# Patient Record
Sex: Female | Born: 1990 | Race: Black or African American | Hispanic: No | Marital: Single | Smoking: Never smoker
Health system: Southern US, Community
[De-identification: ages and names within clinical notes are randomized; demographics above are authoritative.]

## PROBLEM LIST (undated history)

## (undated) DIAGNOSIS — O98819 Other maternal infectious and parasitic diseases complicating pregnancy, unspecified trimester: Secondary | ICD-10-CM

## (undated) DIAGNOSIS — A749 Chlamydial infection, unspecified: Secondary | ICD-10-CM

## (undated) DIAGNOSIS — K859 Acute pancreatitis without necrosis or infection, unspecified: Secondary | ICD-10-CM

---

## 2015-03-31 NOTE — L&D Delivery Note (Signed)
Delivery Note At approximately 6:00 AM a viable female was delivered via Vaginal, Spontaneous Delivery at home (Presentation: unknown).  APGAR: unknown; weight pending Placenta status: spontaneous, intact by EMS, but membranes tore off and were still in the the vagina and LUS.  Cord: 3VC with the following complications: .  Cord pH: NA  Bleeding was initially moderate-heavy. Trailing membranes removed intact and several clots totaling 350 cc's were expressed from the uterus.  ~500 ml of blood on sheets and pads from EMS. EBL roughly estimated at 800 cc's. IV started. Pitocin bolus infusing. Bleeding small amount afterward.  Anesthesia: Local Episiotomy: None Lacerations: Perineum inspected. 2nd degree lac noted. Zerita Boersarlene Lawson, CNM assuming care of pt. See separate note  Mom to postpartum.  Baby to Couplet care / Skin to Skin. SW consult  Dorathy KinsmanVirginia Smith 01/05/2016, 8:44 AM

## 2015-08-26 ENCOUNTER — Emergency Department (HOSPITAL_COMMUNITY)
Admission: EM | Admit: 2015-08-26 | Discharge: 2015-08-26 | Disposition: A | Discharge status: Home / Self Care | Payer: 59 | Attending: Emergency Medicine | Admitting: Emergency Medicine

## 2015-08-26 ENCOUNTER — Encounter (HOSPITAL_COMMUNITY): Payer: Self-pay | Admitting: Emergency Medicine

## 2015-08-26 DIAGNOSIS — J069 Acute upper respiratory infection, unspecified: Secondary | ICD-10-CM | POA: Diagnosis not present

## 2015-08-26 DIAGNOSIS — J029 Acute pharyngitis, unspecified: Secondary | ICD-10-CM | POA: Diagnosis present

## 2015-08-26 LAB — RAPID STREP SCREEN (MED CTR MEBANE ONLY): Streptococcus, Group A Screen (Direct): NEGATIVE

## 2015-08-26 MED ORDER — FLUTICASONE PROPIONATE 50 MCG/ACT NA SUSP: 2.0000 | Freq: Every day | NASAL | Status: DC

## 2015-08-26 MED ORDER — BENZONATATE 100 MG PO CAPS: 100.0000 mg | ORAL_CAPSULE | Freq: Three times a day (TID) | ORAL | Status: DC

## 2015-08-26 MED ORDER — PENICILLIN G BENZATHINE 1200000 UNIT/2ML IM SUSP: 1.2000 10*6.[IU] | Freq: Once | INTRAMUSCULAR | Status: DC

## 2015-08-26 NOTE — Discharge Instructions (Signed)
Upper Respiratory Infection, Adult Most upper respiratory infections (URIs) are a viral infection of the air passages leading to the lungs. A URI affects the nose, throat, and upper air passages. The most common type of URI is nasopharyngitis and is typically referred to as "the common cold." URIs run their course and usually go away on their own. Most of the time, a URI does not require medical attention, but sometimes a bacterial infection in the upper airways can follow a viral infection. This is called a secondary infection. Sinus and middle ear infections are common types of secondary upper respiratory infections. Bacterial pneumonia can also complicate a URI. A URI can worsen asthma and chronic obstructive pulmonary disease (COPD). Sometimes, these complications can require emergency medical care and may be life threatening.  CAUSES Almost all URIs are caused by viruses. A virus is a type of germ and can spread from one person to another.  RISKS FACTORS You may be at risk for a URI if:   You smoke.   You have chronic heart or lung disease.  You have a weakened defense (immune) system.   You are very young or very old.   You have nasal allergies or asthma.  You work in crowded or poorly ventilated areas.  You work in health care facilities or schools. SIGNS AND SYMPTOMS  Symptoms typically develop 2-3 days after you come in contact with a cold virus. Most viral URIs last 7-10 days. However, viral URIs from the influenza virus (flu virus) can last 14-18 days and are typically more severe. Symptoms may include:   Runny or stuffy (congested) nose.   Sneezing.   Cough.   Sore throat.   Headache.   Fatigue.   Fever.   Loss of appetite.   Pain in your forehead, behind your eyes, and over your cheekbones (sinus pain).  Muscle aches.  DIAGNOSIS  Your health care provider may diagnose a URI by:  Physical exam.  Tests to check that your symptoms are not due to  another condition such as:  Strep throat.  Sinusitis.  Pneumonia.  Asthma. TREATMENT  A URI goes away on its own with time. It cannot be cured with medicines, but medicines may be prescribed or recommended to relieve symptoms. Medicines may help:  Reduce your fever.  Reduce your cough.  Relieve nasal congestion. HOME CARE INSTRUCTIONS   Take medicines only as directed by your health care provider.   Gargle warm saltwater or take cough drops to comfort your throat as directed by your health care provider.  Use a warm mist humidifier or inhale steam from a shower to increase air moisture. This may make it easier to breathe.  Drink enough fluid to keep your urine clear or pale yellow.   Eat soups and other clear broths and maintain good nutrition.   Rest as needed.   Return to work when your temperature has returned to normal or as your health care provider advises. You may need to stay home longer to avoid infecting others. You can also use a face mask and careful hand washing to prevent spread of the virus.  Increase the usage of your inhaler if you have asthma.   Do not use any tobacco products, including cigarettes, chewing tobacco, or electronic cigarettes. If you need help quitting, ask your health care provider. PREVENTION  The best way to protect yourself from getting a cold is to practice good hygiene.   Avoid oral or hand contact with people with cold   symptoms.   Wash your hands often if contact occurs.  There is no clear evidence that vitamin C, vitamin E, echinacea, or exercise reduces the chance of developing a cold. However, it is always recommended to get plenty of rest, exercise, and practice good nutrition.  SEEK MEDICAL CARE IF:   You are getting worse rather than better.   Your symptoms are not controlled by medicine.   You have chills.  You have worsening shortness of breath.  You have brown or red mucus.  You have yellow or brown nasal  discharge.  You have pain in your face, especially when you bend forward.  You have a fever.  You have swollen neck glands.  You have pain while swallowing.  You have white areas in the back of your throat. SEEK IMMEDIATE MEDICAL CARE IF:   You have severe or persistent:  Headache.  Ear pain.  Sinus pain.  Chest pain.  You have chronic lung disease and any of the following:  Wheezing.  Prolonged cough.  Coughing up blood.  A change in your usual mucus.  You have a stiff neck.  You have changes in your:  Vision.  Hearing.  Thinking.  Mood. MAKE SURE YOU:   Understand these instructions.  Will watch your condition.  Will get help right away if you are not doing well or get worse.   This information is not intended to replace advice given to you by your health care provider. Make sure you discuss any questions you have with your health care provider.   Document Released: 09/09/2000 Document Revised: 07/31/2014 Document Reviewed: 06/21/2013 Elsevier Interactive Patient Education 2016 Elsevier Inc.  

## 2015-08-26 NOTE — ED Notes (Signed)
Pt c/o sore throat onset 2 days ago. Pt has tried cough medication without relief.

## 2015-08-26 NOTE — ED Provider Notes (Signed)
CSN: [id number]     Arrival date & time 08/26/15  [id number] History   First MD Initiated Contact with Patient 08/26/15 [phone]     Chief Complaint  Patient presents with  . Sore Throat   HPI  25 year old female presents today with complaints of sore throat. Patient reports 2 days of upper respiratory complaints, she reports rhinorrhea, congestion, sore throat and cough. Patient denies any chest pain or shortness of breath, fever, chills, neck stiffness, change in voice, pulling of oral secretions or difficulty swallowing. Patient denies any other complaints, reports using over-the-counter cough medication.   History reviewed. No pertinent past medical history. History reviewed. No pertinent past surgical history. No family history on file. Social History  Substance Use Topics  . Smoking status: Never Smoker   . Smokeless tobacco: None  . Alcohol Use: No   OB History    No data available     Review of Systems  All other systems reviewed and are negative.   Allergies  Review of patient's allergies indicates no known allergies.  Home Medications   Prior to Admission medications   Medication Sig Start Date End Date Taking? Authorizing Provider  benzonatate (TESSALON) 100 MG capsule Take 1 capsule (100 mg total) by mouth every 8 (eight) hours. 08/26/15   Eyvonne MechanicJeffrey Anayla Giannetti, PA-C  fluticasone (FLONASE) 50 MCG/ACT nasal spray Place 2 sprays into both nostrils daily. 08/26/15   Tanganyika Bowlds, PA-C   BP 137/85 mmHg  Pulse 100  Temp(Src) 98.6 F (37 C) (Oral)  Resp 18  Ht 5' 3.5" (1.613 m)  Wt 110.281 kg  BMI 42.39 kg/m2  SpO2 100%   Physical Exam  Constitutional: She is oriented to person, place, and time. She appears well-developed and well-nourished.  HENT:  Head: Normocephalic and atraumatic.  Right Ear: Hearing, tympanic membrane, external ear and ear canal normal.  Left Ear: Hearing, tympanic membrane, external ear and ear canal normal.  Nose: Rhinorrhea present.  Mouth/Throat:  Uvula is midline, oropharynx is clear and moist and mucous membranes are normal. Mucous membranes are not pale, not dry and not cyanotic. No oropharyngeal exudate, posterior oropharyngeal edema, posterior oropharyngeal erythema or tonsillar abscesses.  Eyes: Conjunctivae are normal. Pupils are equal, round, and reactive to light. Right eye exhibits no discharge. Left eye exhibits no discharge. No scleral icterus.  Neck: Normal range of motion. No JVD present. No tracheal deviation present.  Pulmonary/Chest: Effort normal. No stridor.  Neurological: She is alert and oriented to person, place, and time. Coordination normal.  Psychiatric: She has a normal mood and affect. Her behavior is normal. Judgment and thought content normal.  Nursing note and vitals reviewed.   ED Course  Procedures (including critical care time) Labs Review Labs Reviewed  RAPID STREP SCREEN (NOT AT [facility])  CULTURE, GROUP A STREP [facility])    Imaging Review No results found. I have personally reviewed and evaluated these images and lab results as part of my medical decision-making.   EKG Interpretation None      MDM   Final diagnoses:  Viral URI    Labs:  Imaging:  Consults:  Therapeutics:  Discharge Meds:   Assessment/Plan: 25 year old female presents today with complaints of upper respiratory infection. Patient is afebrile nontoxic in no acute distress. Patient is not having chest pain shortness of breath, lung sounds are clear. This is likely viral in nature. Patient will be given prescription for cough medication, and instructed follow-up with primary care for reevaluation. Strict return precautions given.  Eyvonne Mechanic, PA-C 08/26/15 1357  Rolland Porter, MD 09/05/15 2312

## 2015-08-28 LAB — CULTURE, GROUP A STREP (THRC)

## 2015-10-28 ENCOUNTER — Encounter (HOSPITAL_COMMUNITY): Payer: Self-pay | Admitting: *Deleted

## 2015-10-28 ENCOUNTER — Ambulatory Visit (HOSPITAL_COMMUNITY)
Admission: EM | Admit: 2015-10-28 | Discharge: 2015-10-28 | Disposition: A | Discharge status: Home / Self Care | Payer: Self-pay | Attending: Family Medicine | Admitting: Family Medicine

## 2015-10-28 DIAGNOSIS — M778 Other enthesopathies, not elsewhere classified: Secondary | ICD-10-CM

## 2015-10-28 MED ORDER — MELOXICAM 7.5 MG PO TABS: 7.5000 mg | ORAL_TABLET | Freq: Two times a day (BID) | ORAL | 1 refills | Status: DC

## 2015-10-28 NOTE — ED Triage Notes (Signed)
Pt   Reports      Pain     l   Wrist    X   About  5   Days        Pt   denys   Any   specefic  Injury         Some  Swelling  Is  Present      To the   Affected  Wrist

## 2015-10-28 NOTE — ED Provider Notes (Signed)
[facility]    CSN: [id number] Arrival date & time: 10/28/15  1039  First Provider Contact:  None       History   Chief Complaint Chief Complaint  Patient presents with  . Wrist Pain    HPI Kristen Valentine is a 25 y.o. female.   The history is provided by the patient.  Wrist Pain  This is a new problem. The current episode started more than 2 days ago. The problem has been gradually worsening. The symptoms are aggravated by bending. She has tried acetaminophen for the symptoms. The treatment provided no relief.    History reviewed. No pertinent past medical history.  There are no active problems to display for this patient.   History reviewed. No pertinent surgical history.  OB History    No data available       Home Medications    Prior to Admission medications   Medication Sig Start Date End Date Taking? Authorizing Provider  benzonatate (TESSALON) 100 MG capsule Take 1 capsule (100 mg total) by mouth every 8 (eight) hours. 08/26/15   Eyvonne Mechanic, PA-C  fluticasone (FLONASE) 50 MCG/ACT nasal spray Place 2 sprays into both nostrils daily. 08/26/15   Eyvonne Mechanic, PA-C    Family History History reviewed. No pertinent family history.  Social History Social History  Substance Use Topics  . Smoking status: Never Smoker  . Smokeless tobacco: Not on file  . Alcohol use No     Allergies   Review of patient's allergies indicates no known allergies.   Review of Systems Review of Systems  Musculoskeletal: Negative for back pain, gait problem, joint swelling and neck pain.  Skin: Negative.   All other systems reviewed and are negative.    Physical Exam Triage Vital Signs ED Triage Vitals  Enc Vitals Group     BP 10/28/15 1203 142/86     Pulse Rate 10/28/15 1203 78     Resp 10/28/15 1203 18     Temp 10/28/15 1203 98.6 F (37 C)     Temp Source 10/28/15 1203 Oral     SpO2 10/28/15 1203 100 %     Weight --      Height --      Head  Circumference --      Peak Flow --      Pain Score 10/28/15 1210 7     Pain Loc --      Pain Edu? --      Excl. in GC? --    No data found.   Updated Vital Signs BP 142/86 (BP Location: Right Arm)   Pulse 78   Temp 98.6 F (37 C) (Oral)   Resp 18   SpO2 100%   Visual Acuity Right Eye Distance:   Left Eye Distance:   Bilateral Distance:    Right Eye Near:   Left Eye Near:    Bilateral Near:     Physical Exam  Constitutional: She is oriented to person, place, and time. She appears well-developed and well-nourished. No distress.  Musculoskeletal:       Left wrist: She exhibits tenderness, swelling and crepitus. She exhibits normal range of motion, no bony tenderness, no effusion and no deformity.  Neurological: She is alert and oriented to person, place, and time.  Skin: Skin is warm and dry.     UC Treatments / Results  Labs (all labs ordered are listed, but only abnormal results are displayed) Labs Reviewed - No data to display  EKG  EKG Interpretation None       Radiology No results found.  Procedures Procedures (including critical care time)  Medications Ordered in UC Medications - No data to display   Initial Impression / Assessment and Plan / UC Course  I have reviewed the triage vital signs and the nursing notes.  Pertinent labs & imaging results that were available during my care of the patient were reviewed by me and considered in my medical decision making (see chart for details).  Clinical Course      Final Clinical Impressions(s) / UC Diagnoses   Final diagnoses:  None    New Prescriptions New Prescriptions   No medications on file     Linna Hoff, MD 10/28/15 1500

## 2016-01-05 ENCOUNTER — Encounter (HOSPITAL_COMMUNITY): Payer: Self-pay

## 2016-01-05 ENCOUNTER — Inpatient Hospital Stay (HOSPITAL_COMMUNITY)
Admission: AD | Admit: 2016-01-05 | Discharge: 2016-01-09 | DRG: 769 | Disposition: A | Discharge status: Home / Self Care | Payer: Self-pay | Source: Ambulatory Visit | Attending: Obstetrics & Gynecology | Admitting: Obstetrics & Gynecology

## 2016-01-05 DIAGNOSIS — A568 Sexually transmitted chlamydial infection of other sites: Secondary | ICD-10-CM

## 2016-01-05 DIAGNOSIS — O98819 Other maternal infectious and parasitic diseases complicating pregnancy, unspecified trimester: Secondary | ICD-10-CM

## 2016-01-05 DIAGNOSIS — O9832 Other infections with a predominantly sexual mode of transmission complicating childbirth: Secondary | ICD-10-CM

## 2016-01-05 DIAGNOSIS — O9081 Anemia of the puerperium: Secondary | ICD-10-CM | POA: Diagnosis present

## 2016-01-05 DIAGNOSIS — A749 Chlamydial infection, unspecified: Secondary | ICD-10-CM | POA: Diagnosis present

## 2016-01-05 DIAGNOSIS — O133 Gestational [pregnancy-induced] hypertension without significant proteinuria, third trimester: Secondary | ICD-10-CM

## 2016-01-05 DIAGNOSIS — D649 Anemia, unspecified: Secondary | ICD-10-CM | POA: Diagnosis present

## 2016-01-05 HISTORY — DX: Chlamydial infection, unspecified: A74.9

## 2016-01-05 HISTORY — DX: Other maternal infectious and parasitic diseases complicating pregnancy, unspecified trimester: O98.819

## 2016-01-05 LAB — CBC
HEMATOCRIT: 26.3 % — AB (ref 36.0–46.0)
HEMOGLOBIN: 8.4 g/dL — AB (ref 12.0–15.0)
MCH: 20.5 pg — AB (ref 26.0–34.0)
MCHC: 31.9 g/dL (ref 30.0–36.0)
MCV: 64.3 fL — ABNORMAL LOW (ref 78.0–100.0)
Platelets: 336 10*3/uL (ref 150–400)
RBC: 4.09 MIL/uL (ref 3.87–5.11)
RDW: 17.4 % — AB (ref 11.5–15.5)
WBC: 13.9 10*3/uL — ABNORMAL HIGH (ref 4.0–10.5)

## 2016-01-05 LAB — RAPID URINE DRUG SCREEN, HOSP PERFORMED
AMPHETAMINES: NOT DETECTED
BARBITURATES: NOT DETECTED
BENZODIAZEPINES: NOT DETECTED
COCAINE: NOT DETECTED
Opiates: NOT DETECTED
TETRAHYDROCANNABINOL: NOT DETECTED

## 2016-01-05 LAB — URINALYSIS, ROUTINE W REFLEX MICROSCOPIC
BILIRUBIN URINE: NEGATIVE
GLUCOSE, UA: NEGATIVE mg/dL
KETONES UR: NEGATIVE mg/dL
Leukocytes, UA: NEGATIVE
Nitrite: POSITIVE — AB
PH: 6 (ref 5.0–8.0)
Specific Gravity, Urine: 1.015 (ref 1.005–1.030)

## 2016-01-05 LAB — URINE MICROSCOPIC-ADD ON: RBC / HPF: NONE SEEN RBC/hpf (ref 0–5)

## 2016-01-05 LAB — COMPREHENSIVE METABOLIC PANEL
ALBUMIN: 1.9 g/dL — AB (ref 3.5–5.0)
ALK PHOS: 130 U/L — AB (ref 38–126)
ALT: 6 U/L — AB (ref 14–54)
ANION GAP: 10 (ref 5–15)
AST: 18 U/L (ref 15–41)
BILIRUBIN TOTAL: 0.7 mg/dL (ref 0.3–1.2)
BUN: 11 mg/dL (ref 6–20)
CALCIUM: 8.9 mg/dL (ref 8.9–10.3)
CO2: 20 mmol/L — AB (ref 22–32)
CREATININE: 0.8 mg/dL (ref 0.44–1.00)
Chloride: 105 mmol/L (ref 101–111)
GFR calc Af Amer: >60 mL/min (ref >60)
GFR calc non Af Amer: >60 mL/min (ref >60)
GLUCOSE: 89 mg/dL (ref 65–99)
Potassium: 3.9 mmol/L (ref 3.5–5.1)
SODIUM: 135 mmol/L (ref 135–145)
TOTAL PROTEIN: 5.4 g/dL — AB (ref 6.5–8.1)

## 2016-01-05 LAB — PROTEIN / CREATININE RATIO, URINE
Creatinine, Urine: 135 mg/dL
PROTEIN CREATININE RATIO: 4.45 mg/mg{creat} — AB (ref 0.00–0.15)
Total Protein, Urine: 601 mg/dL

## 2016-01-05 LAB — RAPID HIV SCREEN (HIV 1/2 AB+AG)
HIV 1/2 ANTIBODIES: NONREACTIVE
HIV-1 P24 Antigen - HIV24: NONREACTIVE

## 2016-01-05 LAB — HEPATITIS B SURFACE ANTIGEN: Hepatitis B Surface Ag: NEGATIVE

## 2016-01-05 LAB — ABO/RH: ABO/RH(D): O POS

## 2016-01-05 MED ORDER — TETANUS-DIPHTH-ACELL PERTUSSIS 5-2.5-18.5 LF-MCG/0.5 IM SUSP
0.5000 mL | Freq: Once | INTRAMUSCULAR | Status: DC
Start: 2016-01-06 — End: 2016-01-09

## 2016-01-05 MED ORDER — ACETAMINOPHEN 325 MG PO TABS: 650.0000 mg | ORAL_TABLET | ORAL | Status: DC | PRN

## 2016-01-05 MED ORDER — HYDRALAZINE HCL 20 MG/ML IJ SOLN
10.0000 mg | Freq: Once | INTRAMUSCULAR | Status: AC
  Administered 2016-01-05: 10 mg via INTRAMUSCULAR
  Filled 2016-01-05: qty 1

## 2016-01-05 MED ORDER — DIPHENHYDRAMINE HCL 25 MG PO CAPS: 25.0000 mg | ORAL_CAPSULE | Freq: Four times a day (QID) | ORAL | Status: DC | PRN

## 2016-01-05 MED ORDER — HYDRALAZINE HCL 20 MG/ML IJ SOLN
10.0000 mg | Freq: Once | INTRAMUSCULAR | Status: AC | PRN
  Administered 2016-01-06: 10 mg via INTRAVENOUS
  Filled 2016-01-05: qty 1

## 2016-01-05 MED ORDER — ONDANSETRON HCL 4 MG PO TABS: 4.0000 mg | ORAL_TABLET | ORAL | Status: DC | PRN

## 2016-01-05 MED ORDER — LACTATED RINGERS IV SOLN
INTRAVENOUS | Status: DC
  Administered 2016-01-06: 13:00:00 via INTRAVENOUS
  Administered 2016-01-07: 100 mL/h via INTRAVENOUS

## 2016-01-05 MED ORDER — ONDANSETRON HCL 4 MG/2ML IJ SOLN: 4.0000 mg | Freq: Four times a day (QID) | INTRAMUSCULAR | Status: DC | PRN

## 2016-01-05 MED ORDER — SOD CITRATE-CITRIC ACID 500-334 MG/5ML PO SOLN: 30.0000 mL | ORAL | Status: DC | PRN

## 2016-01-05 MED ORDER — IBUPROFEN 600 MG PO TABS
600.0000 mg | ORAL_TABLET | Freq: Four times a day (QID) | ORAL | Status: DC
  Administered 2016-01-05 – 2016-01-06 (×3): 600 mg via ORAL
  Filled 2016-01-05 (×2): qty 1

## 2016-01-05 MED ORDER — MEASLES, MUMPS & RUBELLA VAC ~~LOC~~ INJ
0.5000 mL | INJECTION | Freq: Once | SUBCUTANEOUS | Status: DC
  Filled 2016-01-05: qty 0.5

## 2016-01-05 MED ORDER — FLEET ENEMA 7-19 GM/118ML RE ENEM: 1.0000 | ENEMA | RECTAL | Status: DC | PRN

## 2016-01-05 MED ORDER — OXYTOCIN BOLUS FROM INFUSION: 500.0000 mL | Freq: Once | INTRAVENOUS | Status: DC

## 2016-01-05 MED ORDER — HYDRALAZINE HCL 20 MG/ML IJ SOLN
10.0000 mg | Freq: Once | INTRAMUSCULAR | Status: AC
  Administered 2016-01-05: 10 mg via INTRAVENOUS
  Filled 2016-01-05: qty 1

## 2016-01-05 MED ORDER — SODIUM CHLORIDE 0.9% FLUSH
3.0000 mL | INTRAVENOUS | Status: DC | PRN
  Administered 2016-01-07: 3 mL via INTRAVENOUS
  Filled 2016-01-05: qty 3

## 2016-01-05 MED ORDER — LABETALOL HCL 5 MG/ML IV SOLN
20.0000 mg | INTRAVENOUS | Status: AC | PRN
Start: 2016-01-05 — End: 2016-01-06
  Administered 2016-01-05: 40 mg via INTRAVENOUS
  Administered 2016-01-05 – 2016-01-06 (×2): 20 mg via INTRAVENOUS
  Filled 2016-01-05: qty 16
  Filled 2016-01-05: qty 8
  Filled 2016-01-05: qty 4
  Filled 2016-01-05: qty 8
  Filled 2016-01-05: qty 4

## 2016-01-05 MED ORDER — LACTATED RINGERS IV SOLN: 500.0000 mL | INTRAVENOUS | Status: DC | PRN

## 2016-01-05 MED ORDER — ONDANSETRON HCL 4 MG/2ML IJ SOLN: 4.0000 mg | INTRAMUSCULAR | Status: DC | PRN

## 2016-01-05 MED ORDER — COCONUT OIL OIL: 1.0000 "application " | TOPICAL_OIL | Status: DC | PRN

## 2016-01-05 MED ORDER — ZOLPIDEM TARTRATE 5 MG PO TABS: 5.0000 mg | ORAL_TABLET | Freq: Every evening | ORAL | Status: DC | PRN

## 2016-01-05 MED ORDER — OXYCODONE-ACETAMINOPHEN 5-325 MG PO TABS: 2.0000 | ORAL_TABLET | ORAL | Status: DC | PRN

## 2016-01-05 MED ORDER — PRENATAL MULTIVITAMIN CH
1.0000 | ORAL_TABLET | Freq: Every day | ORAL | Status: DC
  Administered 2016-01-05 – 2016-01-08 (×4): 1 via ORAL
  Filled 2016-01-05 (×4): qty 1

## 2016-01-05 MED ORDER — OXYTOCIN 40 UNITS IN LACTATED RINGERS INFUSION - SIMPLE MED: 2.5000 [IU]/h | INTRAVENOUS | Status: DC

## 2016-01-05 MED ORDER — SENNOSIDES-DOCUSATE SODIUM 8.6-50 MG PO TABS
2.0000 | ORAL_TABLET | ORAL | Status: DC
  Administered 2016-01-06: 2 via ORAL
  Filled 2016-01-05: qty 2

## 2016-01-05 MED ORDER — LIDOCAINE HCL (PF) 1 % IJ SOLN
30.0000 mL | INTRAMUSCULAR | Status: DC | PRN
  Filled 2016-01-05: qty 30

## 2016-01-05 MED ORDER — OXYCODONE-ACETAMINOPHEN 5-325 MG PO TABS: 1.0000 | ORAL_TABLET | ORAL | Status: DC | PRN

## 2016-01-05 MED ORDER — OXYTOCIN 40 UNITS IN LACTATED RINGERS INFUSION - SIMPLE MED
1.0000 m[IU]/min | Freq: Once | INTRAVENOUS | Status: AC
Start: 2016-01-05 — End: 2016-01-05
  Administered 2016-01-05: 40 m[IU]/min via INTRAVENOUS

## 2016-01-05 MED ORDER — BENZOCAINE-MENTHOL 20-0.5 % EX AERO
1.0000 | INHALATION_SPRAY | CUTANEOUS | Status: DC | PRN
Start: 2016-01-05 — End: 2016-01-09
  Administered 2016-01-06: 1 via TOPICAL
  Filled 2016-01-05: qty 56

## 2016-01-05 MED ORDER — SIMETHICONE 80 MG PO CHEW: 80.0000 mg | CHEWABLE_TABLET | ORAL | Status: DC | PRN

## 2016-01-05 MED ORDER — SODIUM CHLORIDE 0.9% FLUSH
3.0000 mL | Freq: Two times a day (BID) | INTRAVENOUS | Status: DC
  Administered 2016-01-07 – 2016-01-08 (×3): 3 mL via INTRAVENOUS

## 2016-01-05 MED ORDER — LIDOCAINE HCL (PF) 1 % IJ SOLN
INTRAMUSCULAR | Status: AC
  Filled 2016-01-05: qty 30

## 2016-01-05 MED ORDER — SODIUM CHLORIDE 0.9 % IV SOLN: 250.0000 mL | INTRAVENOUS | Status: DC | PRN

## 2016-01-05 MED ORDER — LACTATED RINGERS IV BOLUS (SEPSIS)
1000.0000 mL | Freq: Once | INTRAVENOUS | Status: AC
  Administered 2016-01-05: 1000 mL via INTRAVENOUS

## 2016-01-05 NOTE — MAU Note (Signed)
Pt arrived via EMS stating she delivered at work.  Pt states her best guess of time of delivery was 0600.  EMS did not give information about APGAR scores or estimated time of delivery of the placenta.

## 2016-01-05 NOTE — MAU Note (Signed)
Pt up in wheelchair, feeling faint brought back to room for blood pressure check, BP 153/106

## 2016-01-05 NOTE — H&P (Signed)
HPI: Carmie EndMelissa Engh is a 25 y.o. 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 Pt states she didn't know she was pregnant and therefor did not get prenatal care. Sat on the toilet to have a bowel mvmt and the baby came out. States she has had some vaginal bleeding in the past 9 months that she thought was her period, but was very vague about details.   OB History    Gravida Para Term Preterm AB Living   1         1   SAB TAB Ectopic Multiple Live Births           1     Past Medical History:  Diagnosis Date  . Medical history non-contributory    No past surgical history on file. Family History: family history is not on file. Social History:  reports that she has never smoked. She has never used smokeless tobacco. She reports that she does not drink alcohol or use drugs.     Maternal Diabetes: No Genetic Screening: Declined Maternal Ultrasounds/Referrals: Declined Fetal Ultrasounds or other Referrals:  None Maternal Substance Abuse:  No Significant Maternal Medications:  None Significant Maternal Lab Results:  None Other Comments:  No prenatal care. Delivered at home. Unknown gestational age, elevated BP postpartum. Prenatal panel UDS pending.   Review of Systems  Constitutional: Negative for fever.  Eyes: Negative for blurred vision.  Cardiovascular: Positive for leg swelling.  Gastrointestinal: Positive for abdominal pain.   History   Blood pressure 152/100, pulse 115, temperature 98.9 F (37.2 C), resp. rate 18, SpO2 100 %. Exam Physical Exam  Constitutional: She is oriented to person, place, and time. She appears well-developed and well-nourished. No distress.  HENT:  Head: Normocephalic.  Eyes: No scleral icterus.  Cardiovascular:  Tachycardic  Respiratory: Breath sounds normal. No respiratory distress.  GI: Soft. There is no tenderness.  Fundus @U   Genitourinary:  Genitourinary  Comments: Moderate-heavy vaginal bleeding. 2nd degree perineal laceration.   Musculoskeletal: She exhibits edema. She exhibits no tenderness.  Neurological: She is alert and oriented to person, place, and time. She has normal reflexes.  Skin: Skin is warm and dry.  Psychiatric: Her speech is delayed. She is slowed and withdrawn.  Flat affect. Acts uninterested in baby.     Prenatal labs: ABO, Rh:  Pending Antibody:   Pending Rubella:   Pending RPR:   Pending HBsAg:   Pending HIV:   Pending GBS:   Unknown  Assessment: 1. Pendingaginal delivery at home 2. No prenatal care 3. GBS: unknown 4. Approximately term   Plan:  [facility] 2. Routine postpartum orders 3. Prenatal panel, UDS, SW consult   [place] Jashon Ishida 01/05/2016, 8:21 AM

## 2016-01-05 NOTE — Procedures (Signed)
Procedure: 2nd degree lac from childbirth at home. Area infiltrated with  20 cc 1% lidocaine. Repair done with 2-0 vicryl on CT 1 anchor stitch with blanket stitch to repair and subcuticular to close. Good hemostasis obtained . Pt tolerated procedure well and was transferred to post partum unit.

## 2016-01-06 ENCOUNTER — Encounter (HOSPITAL_COMMUNITY): Payer: Self-pay | Admitting: Anesthesiology

## 2016-01-06 MED ORDER — AMLODIPINE BESYLATE 10 MG PO TABS
10.0000 mg | ORAL_TABLET | Freq: Every day | ORAL | Status: DC
  Administered 2016-01-06 – 2016-01-07 (×2): 10 mg via ORAL
  Filled 2016-01-06 (×3): qty 1

## 2016-01-06 MED ORDER — MAGNESIUM SULFATE 50 % IJ SOLN
2.0000 g/h | INTRAVENOUS | Status: DC
  Administered 2016-01-07: 2 g/h via INTRAVENOUS
  Filled 2016-01-06 (×2): qty 80

## 2016-01-06 MED ORDER — ACETAMINOPHEN 325 MG PO TABS
650.0000 mg | ORAL_TABLET | ORAL | Status: DC
  Administered 2016-01-06 – 2016-01-08 (×11): 650 mg via ORAL
  Filled 2016-01-06 (×14): qty 2

## 2016-01-06 MED ORDER — MAGNESIUM SULFATE BOLUS VIA INFUSION
4.0000 g | Freq: Once | INTRAVENOUS | Status: AC
Start: 2016-01-06 — End: 2016-01-06
  Administered 2016-01-06: 4 g via INTRAVENOUS
  Filled 2016-01-06: qty 500

## 2016-01-06 MED ORDER — FUROSEMIDE 10 MG/ML IJ SOLN
20.0000 mg | Freq: Once | INTRAMUSCULAR | Status: AC
Start: 2016-01-06 — End: 2016-01-06
  Administered 2016-01-06: 20 mg via INTRAVENOUS
  Filled 2016-01-06: qty 2

## 2016-01-06 MED ORDER — FERROUS SULFATE 325 (65 FE) MG PO TABS
325.0000 mg | ORAL_TABLET | Freq: Two times a day (BID) | ORAL | Status: DC
  Administered 2016-01-06 – 2016-01-09 (×6): 325 mg via ORAL
  Filled 2016-01-06 (×6): qty 1

## 2016-01-06 MED ORDER — CEPHALEXIN 500 MG PO CAPS
500.0000 mg | ORAL_CAPSULE | Freq: Two times a day (BID) | ORAL | Status: DC
Start: 2016-01-06 — End: 2016-01-08
  Administered 2016-01-06 – 2016-01-07 (×4): 500 mg via ORAL
  Filled 2016-01-06 (×5): qty 1

## 2016-01-06 NOTE — Clinical Social Work Maternal (Signed)
CLINICAL SOCIAL WORK MATERNAL/CHILD NOTE  Patient Details  Name: Kristen Valentine MRN: [id number] Date of Birth: [birth year]  Date:  01/06/2016  Clinical Social Worker Initiating Note:  Laurey Arrow Date/ Time Initiated:  01/06/16/1147     Child's Name:  Kristen Valentine   Legal Guardian:  Mother   Need for Interpreter:  None   Date of Referral:  01/06/16     Reason for Referral:  Late or No Prenatal Care    Referral Source:  Central Nursery   Address:  [address] [address]  Phone number:  [phone]   Household Members:  Self, Roommate   Natural Supports (not living in the home):  Extended Family, Immediate Family, Parent   Professional Supports: None (Referral made to [employer].)   Employment: Full-time   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 )   Other Resources:      Cultural/Religious Considerations Which May Impact Care:  None Reported  Strengths:  Ability to meet basic needs , Pediatrician chosen    Risk Factors/Current Problems:  Other (Comment) (MOB home not prepared for infant due to MOB not being aware that MOB was pregnant. )   Cognitive State:  Alert , Linear Thinking , Insightful , Goal Oriented    Mood/Affect:  Comfortable , Interested , Happy , Calm    CSW Assessment:  CSW met with MOB to complete an assessment for [facility]  MOB was polite, inviting, and interested in meeting with CSW.  When CSW arrived, MOB appeared to be bonding with infant as evidence by MOB engaging in skin to skin.  CSW inquired about MOB's lack of PNC and MOB reported MOB was unaware that MOB was pregnant.  MOB reported that MOB went the rest room while at work because MOB felt like she had to have a bile movement.  MOB communicated that the pain was intense and MOB delivered her baby on the toilet while she was at work. MOB  expressed that MOB was shocked and surprised.  CSW normalized MOB's feelings and inquired about supports for MOB and infant.  MOB stated that FOB is not involved; however, MOB is supported by her father and her immediate and extended family members. MOB reported to CSW that because she did not know she was pregnant, she has absolutely nothing for the infant.  CSW informed MOB of the hospital car seat program for $30.  MOB communicated that she currently has $29.00, and will be able to obtain additional funds when MOB's father arrives from [place].   CSW reached out to [employer] and requested a car seat.  CSW also requested a bundle pack from the security station.  CSW educated MOB about SIDS and Safe Sleep.  MOB was interested and asked appropriate questions.  MOB reported that MOB's father would purchase the infant a pack and play prior to MOB's and infant's dc. CSW educated MOB about the [employer] home visiting parenting program.  MOB expressed that she would love to have a parent educator to meet with her weekly.  CSW completed the referral and forwarded the referral to the [facility].   CSW also give MOB a flyer pertaining to support groups offered by the hospital.  CSW informed MOB of the hospital's policy and procedure regarding NPC. CSW informed MOB of the two screenings for the infant. CSW informed  MOB that the infant had a negative UDS. CSW informed MOB that CSW will continue to monitor infant's cord screen, and will make a report to [facility] if warranted. MOB did not have any questions regarding the hospital's policy.  CSW  educated MOB about PPD.  CSW informed MOB of possible supports and interventions to decrease PPD and reviewed supports for MOB. CSW also encouraged MOB to seek medical attention if needed for increased signs and symptoms for PPD. CSW gave MOB information to apply for [facility] and Medicaid for infant. CSW thanked MOB for meeting with CSW, and encouraged  MOB to reach out to CSW if any questions arise.    CSW Plan/Description:  No Further Intervention Required/No Barriers to Discharge, Patient/Family Education , Information/Referral to [employer]  (CSW will follow infant's cord and will make a referral if warranted.)   Laurey Arrow, MSW, LCSW Clinical Social Work [phone]    Dimple Nanas, LCSW 01/06/2016, 11:53 AM

## 2016-01-06 NOTE — Progress Notes (Signed)
Patient continues to exhibit increased blood pressures through the evening 01/05/16. States she "hasnt had much rest".  No other signs and symptoms of PIH. D.Lawson notified and requested treatment plan. Norvasc to start in a.m.

## 2016-01-06 NOTE — Progress Notes (Signed)
Post Partum Day 1 Subjective: no complaints, up ad lib, voiding and tolerating PO  Objective: Blood pressure (!) 149/80, pulse 98, temperature 98.1 F (36.7 C), resp. rate 20, SpO2 100 %, unknown if currently breastfeeding.  Physical Exam:  General: alert, cooperative and no distress Lochia: appropriate Uterine Fundus: firm Incision: healing well, no dehiscence DVT Evaluation: No evidence of DVT seen on physical exam.   Recent Labs  01/05/16 0800  HGB 8.4*  HCT 26.3*    Assessment/Plan: Plan for discharge tomorrow   LOS: 1 day   Kristen Valentine 01/06/2016, 7:42 AM

## 2016-01-06 NOTE — Lactation Note (Addendum)
This note was copied from a baby's chart. Lactation Consultation Note  Mom is being transferred related to pre-eclampsia. Mother stated she plans to BF. BF was delayed related to lack of prenatal care and need to test for infectious disease and toxicology. Baby has been cleared for BF but recently ate 35 ml of formula and is asleep in the crib.  Explained to mother the need to put baby to breast at every feeding and to only use formula if absolutely necessary.  Mom will need support once she is transferred and if baby does not BF mother may need a double electric breast pump.  Hand expression taught with colostrum visible. Information given on support groups and outpatient services.  Patient Name: Girl Carmie EndMelissa Bear ZOXWR'UToday's Date: 01/06/2016 Reason for consult: Initial assessment   Maternal Data    Feeding Feeding Type: Formula Nipple Type: Slow - flow  LATCH Score/Interventions                      Lactation Tools Discussed/Used     Consult Status Consult Status: Follow-up Date: 01/07/16 Follow-up type: In-patient    Soyla DryerJoseph, Fynn Adel 01/06/2016, 11:02 AM

## 2016-01-07 ENCOUNTER — Encounter (HOSPITAL_COMMUNITY): Payer: Self-pay | Admitting: *Deleted

## 2016-01-07 DIAGNOSIS — O98819 Other maternal infectious and parasitic diseases complicating pregnancy, unspecified trimester: Secondary | ICD-10-CM

## 2016-01-07 DIAGNOSIS — A749 Chlamydial infection, unspecified: Secondary | ICD-10-CM

## 2016-01-07 HISTORY — DX: Chlamydial infection, unspecified: A74.9

## 2016-01-07 HISTORY — DX: Chlamydial infection, unspecified: O98.819

## 2016-01-07 LAB — CBC
HEMATOCRIT: 17.1 % — AB (ref 36.0–46.0)
Hemoglobin: 5.6 g/dL — CL (ref 12.0–15.0)
MCH: 21.3 pg — ABNORMAL LOW (ref 26.0–34.0)
MCHC: 32.7 g/dL (ref 30.0–36.0)
MCV: 65 fL — ABNORMAL LOW (ref 78.0–100.0)
PLATELETS: 290 10*3/uL (ref 150–400)
RBC: 2.63 MIL/uL — ABNORMAL LOW (ref 3.87–5.11)
RDW: 18.2 % — AB (ref 11.5–15.5)
WBC: 17.5 10*3/uL — AB (ref 4.0–10.5)

## 2016-01-07 LAB — PREPARE RBC (CROSSMATCH)

## 2016-01-07 LAB — CULTURE, OB URINE: Culture: NO GROWTH

## 2016-01-07 LAB — GC/CHLAMYDIA PROBE AMP (~~LOC~~) NOT AT ARMC
Chlamydia: POSITIVE — AB
NEISSERIA GONORRHEA: NEGATIVE

## 2016-01-07 MED ORDER — SENNOSIDES-DOCUSATE SODIUM 8.6-50 MG PO TABS: 2.0000 | ORAL_TABLET | Freq: Every evening | ORAL | Status: DC | PRN

## 2016-01-07 MED ORDER — AZITHROMYCIN 500 MG PO TABS
1000.0000 mg | ORAL_TABLET | Freq: Once | ORAL | Status: AC
  Administered 2016-01-07: 1000 mg via ORAL
  Filled 2016-01-07: qty 2

## 2016-01-07 MED ORDER — SODIUM CHLORIDE 0.9 % IV SOLN: Freq: Once | INTRAVENOUS | Status: DC

## 2016-01-07 MED ORDER — POLYETHYLENE GLYCOL 3350 17 G PO PACK
17.0000 g | PACK | Freq: Every day | ORAL | Status: DC
  Filled 2016-01-07 (×2): qty 1

## 2016-01-07 MED ORDER — FUROSEMIDE 10 MG/ML IJ SOLN
10.0000 mg | Freq: Once | INTRAMUSCULAR | Status: DC
  Filled 2016-01-07: qty 1

## 2016-01-07 MED ORDER — ACETAMINOPHEN 325 MG PO TABS
650.0000 mg | ORAL_TABLET | Freq: Once | ORAL | Status: DC
  Filled 2016-01-07: qty 2

## 2016-01-07 MED ORDER — NIFEDIPINE ER 30 MG PO TB24
30.0000 mg | ORAL_TABLET | Freq: Every day | ORAL | Status: DC
  Administered 2016-01-07 – 2016-01-09 (×3): 30 mg via ORAL
  Filled 2016-01-07 (×4): qty 1

## 2016-01-07 MED ORDER — IBUPROFEN 600 MG PO TABS: 600.0000 mg | ORAL_TABLET | Freq: Four times a day (QID) | ORAL | Status: DC | PRN

## 2016-01-07 MED ORDER — DIPHENHYDRAMINE HCL 25 MG PO CAPS
25.0000 mg | ORAL_CAPSULE | Freq: Once | ORAL | Status: AC
  Administered 2016-01-07: 25 mg via ORAL
  Filled 2016-01-07: qty 1

## 2016-01-07 NOTE — Progress Notes (Signed)
CRITICAL VALUE ALERT  Critical value received: Hgb 5.6  Date of notification:01/07/16  Time of notification:  0622   Critical value read back:yes  Nurse who received alert:D. Loleta ChanceHill, RN  MD notified (1st page): [name], CNW   Time of first page:  0622  MD notified (2nd page):NA  Time of second page:NA  Responding MD: NA  Time MD responded: NA

## 2016-01-07 NOTE — Progress Notes (Signed)
Post Partum Day # 2 Subjective: Pt feels tired and weak this morning. She denies any HA or visual changes. Bleeding has decreased. Voiding. Tolerating diet. Pain controlled.  Objective: Blood pressure (!) 155/76, pulse (!) 113, temperature 99.2 F (37.3 C), temperature source Oral, resp. rate 18, weight 229 lb 12 oz (104.2 kg), SpO2 96 %, unknown if currently breastfeeding.  Physical Exam:  General: alert Lochia: appropriate Uterine Fundus: firm DVT Evaluation: No evidence of DVT seen on physical exam. 1-2+ edema, DTR's nl   Recent Labs  01/05/16 0800 01/07/16 0515  HGB 8.4* 5.6*  HCT 26.3* 17.1*    Assessment/Plan: PPD # 2 TSVD at home,  No prenatal care PEC Anemia  SW has seen pt. No barriers no d/c.  BP's stable and good diuresis. Magnesium off today at 1300. Will continue with Norvasc.  Follow BP off magnesium. Anemia reviewed with pt. Blood transfusion recommended to pt. R/B reviewed. Pt agree to transfusion. Hopefully will be able to d/c home tomorrow.        LOS: 2 days   Hermina StaggersMichael L Ervin 01/07/2016, 6:55 AM

## 2016-01-08 ENCOUNTER — Encounter (HOSPITAL_COMMUNITY): Payer: Self-pay | Admitting: Obstetrics and Gynecology

## 2016-01-08 LAB — CBC
HEMATOCRIT: 26.5 % — AB (ref 36.0–46.0)
Hemoglobin: 9 g/dL — ABNORMAL LOW (ref 12.0–15.0)
MCH: 23.7 pg — AB (ref 26.0–34.0)
MCHC: 34 g/dL (ref 30.0–36.0)
MCV: 69.9 fL — AB (ref 78.0–100.0)
Platelets: 294 10*3/uL (ref 150–400)
RBC: 3.79 MIL/uL — ABNORMAL LOW (ref 3.87–5.11)
RDW: 19.7 % — AB (ref 11.5–15.5)
WBC: 14.7 10*3/uL — ABNORMAL HIGH (ref 4.0–10.5)

## 2016-01-08 LAB — TYPE AND SCREEN
ABO/RH(D): O POS
ANTIBODY SCREEN: NEGATIVE
UNIT DIVISION: 0
Unit division: 0
Unit division: 0

## 2016-01-08 LAB — HEMOGLOBIN AND HEMATOCRIT, BLOOD
HCT: 26.2 % — ABNORMAL LOW (ref 36.0–46.0)
Hemoglobin: 8.8 g/dL — ABNORMAL LOW (ref 12.0–15.0)

## 2016-01-08 LAB — RUBELLA SCREEN: Rubella: 1 index (ref >0.99)

## 2016-01-08 LAB — RPR: RPR Ser Ql: NONREACTIVE

## 2016-01-08 NOTE — Lactation Note (Signed)
This note was copied from a baby's chart. Lactation Consultation Note  Patient Name: Kristen Valentine ZOXWR'UToday's Date: 01/08/2016 Reason for consult: Follow-up assessment   Follow up assessment with first time mom of 3079 hour old infant. Infant with 1 BF for 10 minutes, 7 bottle feeds of 25-55 cc, 5 voids and 2 stools in 24 hours preceding this assessment. LATCH Score 5 by bedside RN. Infant weight 6 lb 15.8 oz, 1% over birthweight. Mom with history of PPH and anemia, she received 3 units PRBC's.    MOm reports she would like to BF infant and reports infant is fussy at breast. She is noted to have large compressible breasts and areola with everted nipples. A few gtts of colostrum was noted with hand expression. Assisted mom in awakening infant and placing her STS to right breast. After a few minutes infant latched and was noted to have rhythmic suckling and a few intermittent swallows. Infant was still feeding when I left the room. Showed mom how to place rolled up cloth under her breast to assist with support of breast with feeding.   Enc mom to BF 8-12 x in 24 hours at first feeding cues followed by supplementation of EBM or Formula. Gave mom Formula supplementation guidelines and explained amounts of formula based on day of age.   Set up DEBP with instructions for set up, assembling, disassembling, and cleaning of pump parts. Enc mom to pump every 2-3 hours after BF and to supplement infant with EBM first and follow with formula per supplementation guidelines.   Enc mom to call out to desk for feeding assistance as needed. Follow up tomorrow and prn.     Maternal Data Has patient been taught Hand Expression?: Yes Does the patient have breastfeeding experience prior to this delivery?: No  Feeding Feeding Type: Breast Fed Length of feed: 15 min (still feeding when I left the room)  LATCH Score/Interventions Latch: Grasps breast easily, tongue down, lips flanged, rhythmical  sucking. Intervention(s): Adjust position;Assist with latch;Breast massage;Breast compression  Audible Swallowing: A few with stimulation Intervention(s): Skin to skin;Hand expression Intervention(s): Alternate breast massage  Type of Nipple: Everted at rest and after stimulation  Comfort (Breast/Nipple): Soft / non-tender     Hold (Positioning): Assistance needed to correctly position infant at breast and maintain latch. Intervention(s): Breastfeeding basics reviewed;Support Pillows;Position options;Skin to skin  LATCH Score: 8  Lactation Tools Discussed/Used WIC Program: No (Plans to apply) Pump Review: Setup, frequency, and cleaning;Milk Storage Initiated by:: Noralee StainSharon [place], RN, IBCLC Date initiated:: 01/08/16   Consult Status Consult Status: Follow-up Date: 01/09/16 Follow-up type: In-patient    Silas FloodSharon S Laikyn Gewirtz 01/08/2016, 2:38 PM

## 2016-01-08 NOTE — Progress Notes (Addendum)
Daily Post Partum Note  Kristen Valentine is a 25 y.o. G1P0001 PPD#3 s/p  SVD/2nd  @ term Pregnancy c/b no PNC (pt didn't know she was pregnant), BMI 39, CT on admission 24hr/overnight events:  Pt off 24h Mg at 1300 on 10/10. Pt received 3U PRBC   Subjective:  No s/s of pre-eclampsia. Pt meeting all PP goals. No s/s of anemia  Objective:    Current Vital Signs 24h Vital Sign Ranges  T 98.9 F (37.2 C) Temp  Avg: 99 F (37.2 C)  Min: 98.2 F (36.8 C)  Max: 99.9 F (37.7 C)  BP (!) 150/87 BP  Min: 141/84  Max: 178/87  HR (!) 108 Pulse  Avg: 119.2  Min: 108  Max: 132  RR 18 Resp  Avg: 19.1  Min: 18  Max: 20  SaO2 99 % Not Delivered SpO2  Avg: 98.6 %  Min: 96 %  Max: 100 %       24 Hour I/O Current Shift I/O  Time Ins Outs 10/10 0701 - 10/11 0700 In: 4017.7 [P.O.:2520; I.V.:601.7] Out: 5450 [Urine:5450] No intake/output data recorded.   Patient Vitals for the past 24 hrs:  BP Temp Temp src Pulse Resp SpO2 Height Weight  01/08/16 0547 (!) 150/87 - - (!) 108 18 - - -  01/08/16 0512 (!) 164/98 98.9 F (37.2 C) Oral (!) 118 18 99 % - 101.5 kg (223 lb 12 oz)  01/08/16 0142 (!) 160/90 98.8 F (37.1 C) Oral (!) 122 18 100 % - -  01/07/16 2136 (!) 156/99 98.8 F (37.1 C) Oral (!) 112 18 96 % - -  01/07/16 2026 (!) 146/93 99.2 F (37.3 C) Oral (!) 116 18 100 % - -  01/07/16 1850 (!) 178/87 98.5 F (36.9 C) Oral (!) 122 18 100 % - -  01/07/16 1824 (!) 159/83 99.9 F (37.7 C) Oral (!) 121 20 - - -  01/07/16 1618 (!) 156/71 99.8 F (37.7 C) Oral (!) 126 20 - - -  01/07/16 1550 (!) 147/89 99.3 F (37.4 C) Oral (!) 121 20 100 % - -  01/07/16 1331 (!) 151/77 98.2 F (36.8 C) Oral (!) 132 20 99 % - -  01/07/16 1300 (!) 141/84 99.6 F (37.6 C) Oral (!) 132 20 98 % 5' 3.5" (1.613 m) -  01/07/16 1230 - - - - - 98 % - -  01/07/16 1130 - - - - - 98 % - -  01/07/16 1030 - - - - - 99 % - -  01/07/16 1000 (!) 149/85 98.5 F (36.9 C) Oral (!) 109 20 97 % - -  01/07/16 0930 - - - - - 98 % -  -  01/07/16 0830 (!) 149/95 98.6 F (37 C) Oral (!) 110 20 98 % - -   General: NAD Abdomen: obese, FF below the umbilicus, nttp Perineum: deferred Skin:  Warm and dry.  Cardiovascular: S1, S2 normal, no murmur, rub or gallop, regular rate and rhythm Respiratory:  Clear to auscultation bilateral. Normal respiratory effort Extremities: no c/c/e Neuro: 2+ brachial   Medications Current Facility-Administered Medications  Medication Dose Route Frequency Provider Last Rate Last Dose  . sodium chloride flush (NS) 0.9 % injection 3 mL  3 mL Intravenous Q12H Keitha Butte, CNM   3 mL at 01/07/16 2200   And  . sodium chloride flush (NS) 0.9 % injection 3 mL  3 mL Intravenous PRN Keitha Butte, CNM   3 mL  at 01/07/16 2030   And  . 0.9 %  sodium chloride infusion  250 mL Intravenous PRN Keitha Butte, CNM      . 0.9 %  sodium chloride infusion   Intravenous Once Chancy Milroy, MD      . acetaminophen (TYLENOL) tablet 650 mg  650 mg Oral Q4H [facility], [place]   650 mg at 01/08/16 0720  . acetaminophen (TYLENOL) tablet 650 mg  650 mg Oral Once Chancy Milroy, MD      . benzocaine-Menthol (DERMOPLAST) 20-0.5 % topical spray 1 application  1 application Topical PRN Keitha Butte, CNM   1 application at 85/63/14 2336  . cephALEXin (KEFLEX) capsule 500 mg  500 mg Oral Q12H [facility] [place], DO   500 mg at 01/07/16 2144  . coconut oil  1 application Topical PRN Keitha Butte, CNM      . diphenhydrAMINE (BENADRYL) capsule 25 mg  25 mg Oral Q6H PRN Keitha Butte, CNM      . ferrous sulfate tablet 325 mg  325 mg Oral BID [facility], DO   325 mg at 01/07/16 1841  . ibuprofen (ADVIL,MOTRIN) tablet 600 mg  600 mg Oral Q6H PRN Aletha Halim, MD      . lidocaine (PF) (XYLOCAINE) 1 % injection 30 mL  30 mL Subcutaneous PRN Manya Silvas, CNM      . measles, mumps and rubella vaccine (MMR) injection 0.5 mL  0.5 mL Subcutaneous Once Keitha Butte, CNM      .  NIFEdipine (PROCARDIA-XL/ADALAT CC) 24 hr tablet 30 mg  30 mg Oral Daily Aletha Halim, MD   30 mg at 01/07/16 1904  . ondansetron (ZOFRAN) tablet 4 mg  4 mg Oral Q4H PRN Keitha Butte, CNM       Or  . ondansetron [facility]) injection 4 mg  4 mg Intravenous Q4H PRN Keitha Butte, CNM      . oxyCODONE-acetaminophen (PERCOCET/ROXICET) 5-325 MG per tablet 1 tablet  1 tablet Oral Q4H PRN Manya Silvas, CNM      . oxyCODONE-acetaminophen (PERCOCET/ROXICET) 5-325 MG per tablet 2 tablet  2 tablet Oral Q4H PRN Manya Silvas, CNM      . oxytocin (PITOCIN) IV BOLUS FROM BAG  500 mL Intravenous Once [place], [place]      . oxytocin (PITOCIN) IV infusion 40 units in LR 1000 mL - Premix  2.5 Units/hr Intravenous Continuous Manya Silvas, CNM      . polyethylene glycol (MIRALAX / GLYCOLAX) packet 17 g  17 g Oral Daily Aletha Halim, MD      . prenatal multivitamin tablet 1 tablet  1 tablet Oral Q1200 Keitha Butte, CNM   1 tablet at 01/07/16 1229  . senna-docusate (Senokot-S) tablet 2 tablet  2 tablet Oral QHS PRN Aletha Halim, MD      . simethicone (MYLICON) chewable tablet 80 mg  80 mg Oral PRN Keitha Butte, CNM      . Tdap (BOOSTRIX) injection 0.5 mL  0.5 mL Intramuscular Once Keitha Butte, CNM        Labs:   Recent Labs Lab 01/05/16 0800 01/07/16 0515 01/08/16 0024 01/08/16 0559  WBC 13.9* 17.5*  --  14.7*  HGB 8.4* 5.6* 8.8* 9.0*  HCT 26.3* 17.1* 26.2* 26.5*  PLT 336 290  --  294    Recent Labs Lab 01/05/16 0800  NA 135  K 3.9  CL 105  CO2  20*  BUN 11  CREATININE 0.80  GLUCOSE 89  CALCIUM 8.9    Assessment & Plan:  Pt stable *Postpartum/postop: routine care *CV: procardia 10 qday d/c'ed yesterday and pt started on 52m XL at 1900 *Heme: PO iron. Good rise with PRBC *ID: s/p azithromycin on admission. UCx negative so can stop keflex *No PNC: seen and signed off by SW. UDS negative.  *Dispo: likely tomorrow  O POS / Rubella Immune / Varicella Unknown/  RPR  negative / HIV negative / HepBsAg negative / Tdap UTD: ordered// pap unknown / Bottle  / Contraception: pt still unsure / Circ: not applicable/ [facility]

## 2016-01-09 ENCOUNTER — Encounter (HOSPITAL_COMMUNITY): Payer: Self-pay | Admitting: *Deleted

## 2016-01-09 MED ORDER — FERROUS SULFATE 325 (65 FE) MG PO TABS: 325.0000 mg | ORAL_TABLET | Freq: Two times a day (BID) | ORAL | 3 refills | Status: DC

## 2016-01-09 MED ORDER — IBUPROFEN 600 MG PO TABS: 600.0000 mg | ORAL_TABLET | Freq: Four times a day (QID) | ORAL | 0 refills | Status: DC | PRN

## 2016-01-09 MED ORDER — PRENATAL MULTIVITAMIN CH: 1.0000 | ORAL_TABLET | Freq: Every day | ORAL | 6 refills | Status: DC

## 2016-01-09 MED ORDER — NIFEDIPINE ER 30 MG PO TB24: 30.0000 mg | ORAL_TABLET | Freq: Every day | ORAL | 1 refills | Status: DC

## 2016-01-09 NOTE — Discharge Summary (Signed)
OB Discharge Summary     Patient Name: Kristen EndMelissa Valentine DOB: 08/13/1990 MRN: [id number]  Date of admission: 01/05/2016 Delivering MD:     Date of discharge: 01/09/2016  Admitting diagnosis: gave birth at home brought in by ambulance Intrauterine pregnancy: Unknown     Secondary diagnosis:  Active Problems:   Vaginal delivery   Chlamydia infection affecting pregnancy   Chlamydia infection   Discharge diagnosis: Term Pregnancy Delivered                                                                                                Complications: None  Hospital course:  Pt delivered baby at work and presented to the MAU. Portions of the placenta delivered in hosp.  Had a second  degree perineal lac repaired in hosp.  Pt had an uncomplicated hosp stay.  She was seen by social work.   Physical exam  Vitals:   01/09/16 0310 01/09/16 0514 01/09/16 0526 01/09/16 0635  BP: 139/73 (!) 149/105  (!) 155/99  Pulse: (!) 105 (!) 126    Resp: 18 18    Temp: 97.9 F (36.6 C) 98.5 F (36.9 C)    TempSrc: Oral Oral    SpO2: 100% 100%    Weight:   216 lb (98 kg)   Height:       General: alert, cooperative and no distress Lochia: appropriate Uterine Fundus: firm DVT Evaluation: No evidence of DVT seen on physical exam. Labs: Lab Results  Component Value Date   WBC 14.7 (H) 01/08/2016   HGB 9.0 (L) 01/08/2016   HCT 26.5 (L) 01/08/2016   MCV 69.9 (L) 01/08/2016   PLT 294 01/08/2016   CMP Latest Ref Rng & Units 01/05/2016  Glucose 65 - 99 mg/dL 89  BUN 6 - 20 mg/dL 11  Creatinine 9.140.44 - 7.821.00 mg/dL 9.560.80  Sodium 213135 - 086145 mmol/L 135  Potassium 3.5 - 5.1 mmol/L 3.9  Chloride 101 - 111 mmol/L 105  CO2 22 - 32 mmol/L 20(L)  Calcium 8.9 - 10.3 mg/dL 8.9  Total Protein 6.5 - 8.1 g/dL 5.7(Q5.4(L)  Total Bilirubin 0.3 - 1.2 mg/dL 0.7  Alkaline Phos 38 - 126 U/L 130(H)  AST 15 - 41 U/L 18  ALT 14 - 54 U/L 6(L)    Discharge instruction: per After Visit Summary and "Baby and Me  Booklet".  After visit meds:    Medication List    TAKE these medications   ferrous sulfate 325 (65 FE) MG tablet Take 1 tablet (325 mg total) by mouth 2 (two) times daily with a meal.   ibuprofen 600 MG tablet Commonly known as:  ADVIL,MOTRIN Take 1 tablet (600 mg total) by mouth every 6 (six) hours as needed for headache, mild pain or cramping.   NIFEdipine 30 MG 24 hr tablet Commonly known as:  PROCARDIA-XL/ADALAT CC Take 1 tablet (30 mg total) by mouth daily. Start taking on:  01/10/2016   prenatal multivitamin Tabs tablet Take 1 tablet by mouth daily at 12 noon.       Diet: routine diet  Activity: Advance as tolerated.  Pelvic rest for 6 weeks.   Outpatient follow up:6 weeks Follow up Appt:No future appointments. Follow up Visit:No Follow-up on file.  Postpartum contraception: IUD Ln.  Newborn Data: Live born female  Birth Weight: 6 lb 15.1 oz (3150 g) APGAR: ,   Baby Feeding: Breast Disposition:home with mother   01/09/2016 Willodean Rosenthal, MD

## 2016-01-09 NOTE — Discharge Instructions (Signed)
Vaginal Delivery, Care After °Refer to this sheet in the next few weeks. These discharge instructions provide you with information on caring for yourself after delivery. Your health care provider may also give you specific instructions. Your treatment has been planned according to the most current medical practices available, but problems sometimes occur. Call your health care provider if you have any problems or questions after you go home. °HOME CARE INSTRUCTIONS °· Take over-the-counter or prescription medicines only as directed by your health care provider or pharmacist. °· Do not drink alcohol, especially if you are breastfeeding or taking medicine to relieve pain. °· Do not chew or smoke tobacco. °· Do not use illegal drugs. °· Continue to use good perineal care. Good perineal care includes: °¨ Wiping your perineum from front to back. °¨ Keeping your perineum clean. °· Do not use tampons or douche until your health care provider says it is okay. °· Shower, wash your hair, and take tub baths as directed by your health care provider. °· Wear a well-fitting bra that provides breast support. °· Eat healthy foods. °· Drink enough fluids to keep your urine clear or pale yellow. °· Eat high-fiber foods such as whole grain cereals and breads, brown rice, beans, and fresh fruits and vegetables every day. These foods may help prevent or relieve constipation. °· Follow your health care provider's recommendations regarding resumption of activities such as climbing stairs, driving, lifting, exercising, or traveling. °· Talk to your health care provider about resuming sexual activities. Resumption of sexual activities is dependent upon your risk of infection, your rate of healing, and your comfort and desire to resume sexual activity. °· Try to have someone help you with your household activities and your newborn for at least a few days after you leave the hospital. °· Rest as much as possible. Try to rest or take a nap  when your newborn is sleeping. °· Increase your activities gradually. °· Keep all of your scheduled postpartum appointments. It is very important to keep your scheduled follow-up appointments. At these appointments, your health care provider will be checking to make sure that you are healing physically and emotionally. °SEEK MEDICAL CARE IF:  °· You are passing large clots from your vagina. Save any clots to show your health care provider. °· You have a foul smelling discharge from your vagina. °· You have trouble urinating. °· You are urinating frequently. °· You have pain when you urinate. °· You have a change in your bowel movements. °· You have increasing redness, pain, or swelling near your vaginal incision (episiotomy) or vaginal tear. °· You have pus draining from your episiotomy or vaginal tear. °· Your episiotomy or vaginal tear is separating. °· You have painful, hard, or reddened breasts. °· You have a severe headache. °· You have blurred vision or see spots. °· You feel sad or depressed. °· You have thoughts of hurting yourself or your newborn. °· You have questions about your care, the care of your newborn, or medicines. °· You are dizzy or light-headed. °· You have a rash. °· You have nausea or vomiting. °· You were breastfeeding and have not had a menstrual period within 12 weeks after you stopped breastfeeding. °· You are not breastfeeding and have not had a menstrual period by the 12th week after delivery. °· You have a fever. °SEEK IMMEDIATE MEDICAL CARE IF:  °· You have persistent pain. °· You have chest pain. °· You have shortness of breath. °· You faint. °· You   have leg pain.  You have stomach pain.  Your vaginal bleeding saturates two or more sanitary pads in 1 hour.   This information is not intended to replace advice given to you by your health care provider. Make sure you discuss any questions you have with your health care provider.   Document Released: 03/13/2000 Document Revised:  12/05/2014 Document Reviewed: 11/11/2011 Elsevier Interactive Patient Education 2016 [employer] Levonorgestrel intrauterine device (IUD) What is this medicine? LEVONORGESTREL IUD (LEE voe nor jes trel) is a contraceptive (birth control) device. The device is placed inside the uterus by a healthcare professional. It is used to prevent pregnancy and can also be used to treat heavy bleeding that occurs during your period. Depending on the device, it can be used for 3 to 5 years. This medicine may be used for other purposes; ask your health care provider or pharmacist if you have questions. What should I tell my health care provider before I take this medicine? They need to know if you have any of these conditions: -abnormal Pap smear -cancer of the breast, uterus, or cervix -diabetes -endometritis -genital or pelvic infection now or in the past -have more than one sexual partner or your partner has more than one partner -heart disease -history of an ectopic or tubal pregnancy -immune system problems -IUD in place -liver disease or tumor -problems with blood clots or take blood-thinners -use intravenous drugs -uterus of unusual shape -vaginal bleeding that has not been explained -an unusual or allergic reaction to levonorgestrel, other hormones, silicone, or polyethylene, medicines, foods, dyes, or preservatives -pregnant or trying to get pregnant -breast-feeding How should I use this medicine? This device is placed inside the uterus by a health care professional. Talk to your pediatrician regarding the use of this medicine in children. Special care may be needed. Overdosage: If you think you have taken too much of this medicine contact a poison control center or emergency room at once. NOTE: This medicine is only for you. Do not share this medicine with others. What if I miss a dose? This does not apply. What may interact with this medicine? Do not take this medicine with any of  the following medications: -amprenavir -bosentan -fosamprenavir This medicine may also interact with the following medications: -aprepitant -barbiturate medicines for inducing sleep or treating seizures -bexarotene -griseofulvin -medicines to treat seizures like carbamazepine, ethotoin, felbamate, oxcarbazepine, phenytoin, topiramate -modafinil -pioglitazone -rifabutin -rifampin -rifapentine -some medicines to treat HIV infection like atazanavir, indinavir, lopinavir, nelfinavir, tipranavir, ritonavir -St. John's wort -warfarin This list may not describe all possible interactions. Give your health care provider a list of all the medicines, herbs, non-prescription drugs, or dietary supplements you use. Also tell them if you smoke, drink alcohol, or use illegal drugs. Some items may interact with your medicine. What should I watch for while using this medicine? Visit your doctor or health care professional for regular check ups. See your doctor if you or your partner has sexual contact with others, becomes HIV positive, or gets a sexual transmitted disease. This product does not protect you against HIV infection (AIDS) or other sexually transmitted diseases. You can check the placement of the IUD yourself by reaching up to the top of your vagina with clean fingers to feel the threads. Do not pull on the threads. It is a good habit to check placement after each menstrual period. Call your doctor right away if you feel more of the IUD than just the threads or if you cannot feel  the threads at all. The IUD may come out by itself. You may become pregnant if the device comes out. If you notice that the IUD has come out use a backup birth control method like condoms and call your health care provider. Using tampons will not change the position of the IUD and are okay to use during your period. What side effects may I notice from receiving this medicine? Side effects that you should report to your  doctor or health care professional as soon as possible: -allergic reactions like skin rash, itching or hives, swelling of the face, lips, or tongue -fever, flu-like symptoms -genital sores -high blood pressure -no menstrual period for 6 weeks during use -pain, swelling, warmth in the leg -pelvic pain or tenderness -severe or sudden headache -signs of pregnancy -stomach cramping -sudden shortness of breath -trouble with balance, talking, or walking -unusual vaginal bleeding, discharge -yellowing of the eyes or skin Side effects that usually do not require medical attention (report to your doctor or health care professional if they continue or are bothersome): -acne -breast pain -change in sex drive or performance -changes in weight -cramping, dizziness, or faintness while the device is being inserted -headache -irregular menstrual bleeding within first 3 to 6 months of use -nausea This list may not describe all possible side effects. Call your doctor for medical advice about side effects. You may report side effects to FDA at 1-800-FDA-1088. Where should I keep my medicine? This does not apply. NOTE: This sheet is a summary. It may not cover all possible information. If you have questions about this medicine, talk to your doctor, pharmacist, or health care provider.    2016, Elsevier/Gold Standard. (2011-04-16 13:54:04)

## 2016-01-09 NOTE — Progress Notes (Signed)
Patient discharged home with her father. Discharge instructions and paperwork reviewed with patient. Prescriptions given to patient. Follow-up blood pressure check reviewed and preeclampsia handout given to patient.  No questions at this time.

## 2016-01-09 NOTE — Lactation Note (Signed)
This note was copied from a baby's chart. Lactation Consultation Note  Patient Name: Kristen Valentine ZOXWR'UToday's Date: 01/09/2016 Reason for consult: Follow-up assessment  Baby 44 days old. Mom reports that she has decided not to nurse the baby. Instead, mom wants to continue to feed baby with formula. Provided anticipatory guidance and discussed breast engorgement prevention/treatment. Mom aware of OP/BFSG and LC phone line assistance after D/C.    Maternal Data    Feeding    LATCH Score/Interventions                      Lactation Tools Discussed/Used     Consult Status Consult Status: PRN    Sherlyn HayJennifer D Sekai Gitlin 01/09/2016, 11:38 AM

## 2016-01-16 ENCOUNTER — Ambulatory Visit: Payer: Self-pay | Admitting: *Deleted

## 2016-01-16 VITALS — BP 152/88 | HR 97

## 2016-01-16 DIAGNOSIS — O135 Gestational [pregnancy-induced] hypertension without significant proteinuria, complicating the puerperium: Secondary | ICD-10-CM

## 2016-01-16 NOTE — Progress Notes (Signed)
Here for bp check. Had a vaginal delivery 01/05/16 . Discharged on 01/09/16 after being on magnesium for elevated bp. Is taking procardia.  I called and reported bp and assessment to Dr. Erin FullingHarraway-Smith.  Continue taking procardia and come to pp appt as scheduled. I informed patient of this and she voices understanding.

## 2016-01-29 ENCOUNTER — Encounter (HOSPITAL_COMMUNITY): Payer: Self-pay | Admitting: *Deleted

## 2016-02-17 ENCOUNTER — Ambulatory Visit (INDEPENDENT_AMBULATORY_CARE_PROVIDER_SITE_OTHER): Payer: Self-pay | Admitting: Student

## 2016-02-17 ENCOUNTER — Encounter: Payer: Self-pay | Admitting: Student

## 2016-02-17 NOTE — Progress Notes (Signed)
Subjective:     Kristen Valentine is a 25 y.o. female who presents for a postpartum visit. She is 5 weeks postpartum following a vaginal delivery. I have fully reviewed the prenatal and intrapartum course. The delivery was at  gestational weeks. Outcome: vaginal. Anesthesia: natural. Postpartum course has been uncomplicated. Baby's course has been uncomplicated. Baby is feeding by bottle. Bleeding normal. Bowel function is normal. Bladder function is normal. Patient is not sexually active. Contraception method is none at this time. Postpartum depression screening: negative  The following portions of the patient's history were reviewed and updated as appropriate: allergies, current medications, past family history, past medical history, past social history, past surgical history and problem list.  Review of Systems Pertinent items are noted in HPI.   Objective:    BP 136/61   Pulse (!) 56   Wt 219 lb 3.2 oz (99.4 kg)   BMI 38.22 kg/m   General:  alert, cooperative, appears stated age, no distress and moderately obese  Lungs: clear to auscultation bilaterally  Heart:  regular rate and rhythm, S1, S2 normal, no murmur, click, rub or gallop  Abdomen: soft, non-tender; bowel sounds normal; no masses,  no organomegaly       Assessment:     Normal postpartum exam. Pap smear not done at today's visit.   Plan:    1. Contraception: condoms 2. Pt states Medicaid is pending -- will schedule f/u appointment for pap smear once it is active

## 2016-02-17 NOTE — Patient Instructions (Addendum)
Pap smear clinic -- [phone] [facility] -- [phone]     Contraception Choices Contraception (birth control) is the use of any methods or devices to prevent pregnancy. Below are some methods to help avoid pregnancy. Hormonal methods  Contraceptive implant. This is a thin, plastic tube containing progesterone hormone. It does not contain estrogen hormone. Your health care provider inserts the tube in the inner part of the upper arm. The tube can remain in place for up to 3 years. After 3 years, the implant must be removed. The implant prevents the ovaries from releasing an egg (ovulation), thickens the cervical mucus to prevent sperm from entering the uterus, and thins the lining of the inside of the uterus.  Progesterone-only injections. These injections are given every 3 months by your health care provider to prevent pregnancy. This synthetic progesterone hormone stops the ovaries from releasing eggs. It also thickens cervical mucus and changes the uterine lining. This makes it harder for sperm to survive in the uterus.  Birth control pills. These pills contain estrogen and progesterone hormone. They work by preventing the ovaries from releasing eggs (ovulation). They also cause the cervical mucus to thicken, preventing the sperm from entering the uterus. Birth control pills are prescribed by a health care provider.Birth control pills can also be used to treat heavy periods.  Minipill. This type of birth control pill contains only the progesterone hormone. They are taken every day of each month and must be prescribed by your health care provider.  Birth control patch. The patch contains hormones similar to those in birth control pills. It must be changed once a week and is prescribed by a health care provider.  Vaginal ring. The ring contains hormones similar to those in birth control pills. It is left in the vagina for 3 weeks, removed for 1 week, and then a new one is put  back in place. The patient must be comfortable inserting and removing the ring from the vagina.A health care provider's prescription is necessary.  Emergency contraception. Emergency contraceptives prevent pregnancy after unprotected sexual intercourse. This pill can be taken right after sex or up to 5 days after unprotected sex. It is most effective the sooner you take the pills after having sexual intercourse. Most emergency contraceptive pills are available without a prescription. Check with your pharmacist. Do not use emergency contraception as your only form of birth control. Barrier methods  Female condom. This is a thin sheath (latex or rubber) that is worn over the penis during sexual intercourse. It can be used with spermicide to increase effectiveness.  Female condom. This is a soft, loose-fitting sheath that is put into the vagina before sexual intercourse.  Diaphragm. This is a soft, latex, dome-shaped barrier that must be fitted by a health care provider. It is inserted into the vagina, along with a spermicidal jelly. It is inserted before intercourse. The diaphragm should be left in the vagina for 6 to 8 hours after intercourse.  Cervical cap. This is a round, soft, latex or plastic cup that fits over the cervix and must be fitted by a health care provider. The cap can be left in place for up to 48 hours after intercourse.  Sponge. This is a soft, circular piece of polyurethane foam. The sponge has spermicide in it. It is inserted into the vagina after wetting it and before sexual intercourse.  Spermicides. These are chemicals that kill or block sperm from entering the cervix and uterus. They come in the form  of creams, jellies, suppositories, foam, or tablets. They do not require a prescription. They are inserted into the vagina with an applicator before having sexual intercourse. The process must be repeated every time you have sexual intercourse. Intrauterine  contraception  Intrauterine device (IUD). This is a T-shaped device that is put in a woman's uterus during a menstrual period to prevent pregnancy. There are 2 types:  Copper IUD. This type of IUD is wrapped in copper wire and is placed inside the uterus. Copper makes the uterus and fallopian tubes produce a fluid that kills sperm. It can stay in place for 10 years.  Hormone IUD. This type of IUD contains the hormone progestin (synthetic progesterone). The hormone thickens the cervical mucus and prevents sperm from entering the uterus, and it also thins the uterine lining to prevent implantation of a fertilized egg. The hormone can weaken or kill the sperm that get into the uterus. It can stay in place for 3-5 years, depending on which type of IUD is used. Permanent methods of contraception  Female tubal ligation. This is when the woman's fallopian tubes are surgically sealed, tied, or blocked to prevent the egg from traveling to the uterus.  Hysteroscopic sterilization. This involves placing a small coil or insert into each fallopian tube. Your doctor uses a technique called hysteroscopy to do the procedure. The device causes scar tissue to form. This results in permanent blockage of the fallopian tubes, so the sperm cannot fertilize the egg. It takes about 3 months after the procedure for the tubes to become blocked. You must use another form of birth control for these 3 months.  Female sterilization. This is when the female has the tubes that carry sperm tied off (vasectomy).This blocks sperm from entering the vagina during sexual intercourse. After the procedure, the man can still ejaculate fluid (semen). Natural planning methods  Natural family planning. This is not having sexual intercourse or using a barrier method (condom, diaphragm, cervical cap) on days the woman could become pregnant.  Calendar method. This is keeping track of the length of each menstrual cycle and identifying when you are  fertile.  Ovulation method. This is avoiding sexual intercourse during ovulation.  Symptothermal method. This is avoiding sexual intercourse during ovulation, using a thermometer and ovulation symptoms.  Post-ovulation method. This is timing sexual intercourse after you have ovulated. Regardless of which type or method of contraception you choose, it is important that you use condoms to protect against the transmission of sexually transmitted infections (STIs). Talk with your health care provider about which form of contraception is most appropriate for you. This information is not intended to replace advice given to you by your health care provider. Make sure you discuss any questions you have with your health care provider. Document Released: 03/16/2005 Document Revised: 08/22/2015 Document Reviewed: 09/08/2012 Elsevier Interactive Patient Education  2017 [employer]

## 2016-03-12 ENCOUNTER — Ambulatory Visit: Payer: Self-pay | Admitting: Student

## 2017-11-11 ENCOUNTER — Inpatient Hospital Stay (HOSPITAL_COMMUNITY)
Admission: EM | Admit: 2017-11-11 | Discharge: 2017-11-14 | DRG: 417 | Disposition: A | Discharge status: Home / Self Care | Payer: Medicaid Other | Attending: Student in an Organized Health Care Education/Training Program | Admitting: Student in an Organized Health Care Education/Training Program

## 2017-11-11 ENCOUNTER — Emergency Department (HOSPITAL_COMMUNITY): Payer: Medicaid Other

## 2017-11-11 ENCOUNTER — Other Ambulatory Visit: Payer: Self-pay

## 2017-11-11 ENCOUNTER — Encounter (HOSPITAL_COMMUNITY): Payer: Self-pay | Admitting: Emergency Medicine

## 2017-11-11 ENCOUNTER — Ambulatory Visit (HOSPITAL_COMMUNITY): Payer: 59

## 2017-11-11 DIAGNOSIS — K831 Obstruction of bile duct: Secondary | ICD-10-CM | POA: Diagnosis present

## 2017-11-11 DIAGNOSIS — K811 Chronic cholecystitis: Secondary | ICD-10-CM | POA: Diagnosis present

## 2017-11-11 DIAGNOSIS — K858 Other acute pancreatitis without necrosis or infection: Secondary | ICD-10-CM

## 2017-11-11 DIAGNOSIS — E669 Obesity, unspecified: Secondary | ICD-10-CM | POA: Diagnosis present

## 2017-11-11 DIAGNOSIS — E875 Hyperkalemia: Secondary | ICD-10-CM | POA: Diagnosis not present

## 2017-11-11 DIAGNOSIS — E876 Hypokalemia: Secondary | ICD-10-CM | POA: Diagnosis present

## 2017-11-11 DIAGNOSIS — A599 Trichomoniasis, unspecified: Secondary | ICD-10-CM | POA: Diagnosis present

## 2017-11-11 DIAGNOSIS — Z538 Procedure and treatment not carried out for other reasons: Secondary | ICD-10-CM | POA: Diagnosis not present

## 2017-11-11 DIAGNOSIS — K810 Acute cholecystitis: Secondary | ICD-10-CM

## 2017-11-11 DIAGNOSIS — N3001 Acute cystitis with hematuria: Secondary | ICD-10-CM

## 2017-11-11 DIAGNOSIS — K859 Acute pancreatitis without necrosis or infection, unspecified: Secondary | ICD-10-CM

## 2017-11-11 DIAGNOSIS — K851 Biliary acute pancreatitis without necrosis or infection: Principal | ICD-10-CM | POA: Diagnosis present

## 2017-11-11 DIAGNOSIS — Z8774 Personal history of (corrected) congenital malformations of heart and circulatory system: Secondary | ICD-10-CM

## 2017-11-11 DIAGNOSIS — Z419 Encounter for procedure for purposes other than remedying health state, unspecified: Secondary | ICD-10-CM

## 2017-11-11 DIAGNOSIS — N390 Urinary tract infection, site not specified: Secondary | ICD-10-CM

## 2017-11-11 DIAGNOSIS — Z6841 Body Mass Index (BMI) 40.0 and over, adult: Secondary | ICD-10-CM

## 2017-11-11 HISTORY — DX: Acute pancreatitis without necrosis or infection, unspecified: K85.90

## 2017-11-11 LAB — I-STAT BETA HCG BLOOD, ED (MC, WL, AP ONLY)

## 2017-11-11 LAB — CBC
HCT: 38.8 % (ref 36.0–46.0)
HEMOGLOBIN: 12.2 g/dL (ref 12.0–15.0)
MCH: 22.7 pg — ABNORMAL LOW (ref 26.0–34.0)
MCHC: 31.4 g/dL (ref 30.0–36.0)
MCV: 72.1 fL — AB (ref 78.0–100.0)
Platelets: 406 10*3/uL — ABNORMAL HIGH (ref 150–400)
RBC: 5.38 MIL/uL — ABNORMAL HIGH (ref 3.87–5.11)
RDW: 15.7 % — ABNORMAL HIGH (ref 11.5–15.5)
WBC: 8.8 10*3/uL (ref 4.0–10.5)

## 2017-11-11 LAB — COMPREHENSIVE METABOLIC PANEL
ALBUMIN: 3.6 g/dL (ref 3.5–5.0)
ALK PHOS: 244 U/L — AB (ref 38–126)
ALT: 192 U/L — AB (ref 0–44)
ANION GAP: 13 (ref 5–15)
AST: 83 U/L — ABNORMAL HIGH (ref 15–41)
BUN: 5 mg/dL — ABNORMAL LOW (ref 6–20)
CALCIUM: 9.5 mg/dL (ref 8.9–10.3)
CHLORIDE: 102 mmol/L (ref 98–111)
CO2: 24 mmol/L (ref 22–32)
CREATININE: 0.85 mg/dL (ref 0.44–1.00)
GFR calc Af Amer: >60 mL/min (ref >60)
GFR calc non Af Amer: >60 mL/min (ref >60)
GLUCOSE: 100 mg/dL — AB (ref 70–99)
Potassium: 3.4 mmol/L — ABNORMAL LOW (ref 3.5–5.1)
SODIUM: 139 mmol/L (ref 135–145)
Total Bilirubin: 2.2 mg/dL — ABNORMAL HIGH (ref 0.3–1.2)
Total Protein: 8.5 g/dL — ABNORMAL HIGH (ref 6.5–8.1)

## 2017-11-11 LAB — URINALYSIS, MICROSCOPIC (REFLEX)

## 2017-11-11 LAB — LIPASE, BLOOD: LIPASE: 709 U/L — AB (ref 11–51)

## 2017-11-11 LAB — URINALYSIS, ROUTINE W REFLEX MICROSCOPIC
GLUCOSE, UA: NEGATIVE mg/dL
Ketones, ur: >80 mg/dL — AB
Nitrite: POSITIVE — AB
PH: 5.5 (ref 5.0–8.0)
Protein, ur: 100 mg/dL — AB
SPECIFIC GRAVITY, URINE: 1.025 (ref 1.005–1.030)

## 2017-11-11 MED ORDER — ONDANSETRON HCL 4 MG/2ML IJ SOLN
4.0000 mg | Freq: Once | INTRAMUSCULAR | Status: AC
  Administered 2017-11-11: 4 mg via INTRAVENOUS
  Filled 2017-11-11: qty 2

## 2017-11-11 MED ORDER — ENOXAPARIN SODIUM 40 MG/0.4ML ~~LOC~~ SOLN
40.0000 mg | SUBCUTANEOUS | Status: DC
  Administered 2017-11-11 – 2017-11-13 (×3): 40 mg via SUBCUTANEOUS
  Filled 2017-11-11 (×3): qty 0.4

## 2017-11-11 MED ORDER — CEPHALEXIN 250 MG PO CAPS
500.0000 mg | ORAL_CAPSULE | Freq: Once | ORAL | Status: AC
  Administered 2017-11-11: 500 mg via ORAL
  Filled 2017-11-11: qty 2

## 2017-11-11 MED ORDER — FENTANYL CITRATE (PF) 100 MCG/2ML IJ SOLN
25.0000 ug | INTRAMUSCULAR | Status: DC | PRN
  Administered 2017-11-11 (×2): 25 ug via INTRAVENOUS
  Filled 2017-11-11 (×2): qty 2

## 2017-11-11 MED ORDER — KCL-LACTATED RINGERS 20 MEQ/L IV SOLN
INTRAVENOUS | Status: DC
  Administered 2017-11-11 – 2017-11-13 (×5): via INTRAVENOUS
  Filled 2017-11-11 (×15): qty 1000

## 2017-11-11 MED ORDER — METRONIDAZOLE 500 MG PO TABS
500.0000 mg | ORAL_TABLET | Freq: Once | ORAL | Status: AC
  Administered 2017-11-11: 500 mg via ORAL
  Filled 2017-11-11: qty 1

## 2017-11-11 MED ORDER — FENTANYL CITRATE (PF) 100 MCG/2ML IJ SOLN
50.0000 ug | Freq: Once | INTRAMUSCULAR | Status: AC
  Administered 2017-11-11: 50 ug via INTRAVENOUS
  Filled 2017-11-11: qty 2

## 2017-11-11 MED ORDER — OXYCODONE-ACETAMINOPHEN 5-325 MG PO TABS
2.0000 | ORAL_TABLET | Freq: Once | ORAL | Status: AC
  Administered 2017-11-11: 2 via ORAL
  Filled 2017-11-11: qty 2

## 2017-11-11 MED ORDER — LACTATED RINGERS IV BOLUS
1000.0000 mL | Freq: Once | INTRAVENOUS | Status: AC
  Administered 2017-11-11: 1000 mL via INTRAVENOUS

## 2017-11-11 NOTE — ED Notes (Signed)
Patient transported to Ultrasound 

## 2017-11-11 NOTE — ED Notes (Signed)
Report attempted 

## 2017-11-11 NOTE — ED Provider Notes (Signed)
[facility] Provider Note   CSN: [id number] Arrival date & time: 11/11/17  0744     History   Chief Complaint Chief Complaint  Patient presents with  . Abdominal Pain    HPI Kristen Valentine is a 27 y.o. female who presents with 5 days of severe abdominal pain.  She says that she has had 10 out of 10, sharp abdominal pain in the epigastric region since Sunday.  She reports having nausea, vomiting, and diarrhea since then.  She says she has been unable to tolerate p.o. since Sunday.  She denies fever, chills, chest pain, shortness of breath.  She denies dysuria and change in vaginal discharge.  She says she does not drink alcohol.  She has never had these symptoms before.  HPI  Past Medical History:  Diagnosis Date  . Chlamydia infection affecting pregnancy 01/07/2016    Patient Active Problem List   Diagnosis Date Noted  . Pancreatitis 11/11/2017  . Chlamydia infection affecting pregnancy 01/07/2016  . Chlamydia infection 01/07/2016  . Vaginal delivery 01/05/2016    History reviewed. No pertinent surgical history.   OB History    Gravida  1   Para  1   Term  0   Preterm  0   AB  0   Living  1     SAB  0   TAB  0   Ectopic  0   Multiple  0   Live Births  1            Home Medications    Prior to Admission medications   Medication Sig Start Date End Date Taking? Authorizing Provider  ferrous sulfate 325 (65 FE) MG tablet Take 1 tablet (325 mg total) by mouth 2 (two) times daily with a meal. Patient not taking: Reported on 11/11/2017 01/09/16   Willodean RosenthalHarraway-Smith, Carolyn, MD  ibuprofen (ADVIL,MOTRIN) 600 MG tablet Take 1 tablet (600 mg total) by mouth every 6 (six) hours as needed for headache, mild pain or cramping. Patient not taking: Reported on 11/11/2017 01/09/16   Willodean RosenthalHarraway-Smith, Carolyn, MD  NIFEdipine (PROCARDIA-XL/ADALAT CC) 30 MG 24 hr tablet Take 1 tablet (30 mg total) by mouth daily. Patient not  taking: Reported on 11/11/2017 01/10/16   Willodean RosenthalHarraway-Smith, Carolyn, MD  Prenatal Vit-Fe Fumarate-FA (PRENATAL MULTIVITAMIN) TABS tablet Take 1 tablet by mouth daily at 12 noon. Patient not taking: Reported on 11/11/2017 01/09/16   Willodean RosenthalHarraway-Smith, Carolyn, MD    Family History History reviewed. No pertinent family history.  Social History Social History   Tobacco Use  . Smoking status: Never Smoker  . Smokeless tobacco: Never Used  Substance Use Topics  . Alcohol use: No  . Drug use: No     Allergies   Patient has no known allergies.   Review of Systems Review of Systems Review of Systems   Constitutional  Negative for fever  Negative for chills  HENT  Negative for ear pain  Negative for sore throat  Negative for difficultly swallowing  Eyes  Negative for eye pain  Negative for visual disturbance  Respiratory  Negative for shortness of breath  Negative for cough  CV  Negative for chest pain  Negative for leg swelling  Abdomen  +for abdominal pain  +for nausea  +for vomiting  MSK  Negative for extremity pain  Negative for back pain  Skin  Negative for rash  Negative for wound  Neuro  Negative for syncope  Negative for difficultly speaking  Psych  Negative for confusion   The remainder of the ROS was reviewed and negative except as documented above.      Physical Exam Updated Vital Signs BP 102/88   Pulse 95   Temp 98.7 F (37.1 C) (Oral)   Resp 16   SpO2 99%   Physical Exam Physical Exam Constitutional  Nursing notes reviewed  Vital signs reviewed  HEENT  No obvious trauma  Supple without meningismus, mass, or overt JVD  EOMI  Mild scleral icterus  Respiratory  Effort normal  CTAB  No respiratory distress  CV  Normal rate  No obvious murmurs  No pitting edema  Abdomen  Soft  Epigastric tenderness  No RUQ, LLQ, RLQ, or LUQ tenderness  No peritonitis  MSK  Atraumatic  No obvious deformity  ROM  appropriate  Skin  Warm  Dry  Neuro  Awake and alert  EOMI  Moving all extremities  Psychiatric  Mood and affect normal        ED Treatments / Results  Labs (all labs ordered are listed, but only abnormal results are displayed) Labs Reviewed  LIPASE, BLOOD - Abnormal; Notable for the following components:      Result Value   Lipase 709 (*)    All other components within normal limits  COMPREHENSIVE METABOLIC PANEL - Abnormal; Notable for the following components:   Potassium 3.4 (*)    Glucose, Bld 100 (*)    BUN 5 (*)    Total Protein 8.5 (*)    AST 83 (*)    ALT 192 (*)    Alkaline Phosphatase 244 (*)    Total Bilirubin 2.2 (*)    All other components within normal limits  CBC - Abnormal; Notable for the following components:   RBC 5.38 (*)    MCV 72.1 (*)    MCH 22.7 (*)    RDW 15.7 (*)    Platelets 406 (*)    All other components within normal limits  URINALYSIS, ROUTINE W REFLEX MICROSCOPIC - Abnormal; Notable for the following components:   Color, Urine AMBER (*)    APPearance TURBID (*)    Hgb urine dipstick TRACE (*)    Bilirubin Urine MODERATE (*)    Ketones, ur >80 (*)    Protein, ur 100 (*)    Nitrite POSITIVE (*)    Leukocytes, UA MODERATE (*)    All other components within normal limits  URINALYSIS, MICROSCOPIC (REFLEX) - Abnormal; Notable for the following components:   Bacteria, UA FEW (*)    Trichomonas, UA PRESENT (*)    All other components within normal limits  I-STAT BETA HCG BLOOD, ED ([facility], [facility], [facility] ONLY)    EKG None  Radiology [place] Abdomen Limited  Result Date: 11/11/2017 CLINICAL DATA:  Upper abdominal pain EXAM: ULTRASOUND ABDOMEN LIMITED RIGHT UPPER QUADRANT COMPARISON:  None. FINDINGS: Gallbladder: Within the gallbladder, there are echogenic foci which move and shadow consistent with cholelithiasis. Largest gallstone measures 1.3 cm in length. There is no appreciable gallbladder wall thickening or pericholecystic fluid. No  sonographic Murphy sign noted by sonographer. Common bile duct: Diameter: 4 mm. No intrahepatic or extrahepatic biliary duct dilatation. Liver: No focal lesion identified. Within normal limits in parenchymal echogenicity. Portal vein is patent on color Doppler imaging with normal direction of blood flow towards the liver. IMPRESSION: Cholelithiasis. No gallbladder wall thickening or pericholecystic fluid. Study otherwise unremarkable. Electronically Signed   By: Bretta BangWilliam  Woodruff III M.D.   On: 11/11/2017 12:06  Procedures Procedures (including critical care time)  Medications Ordered in ED Medications  lactated ringers with KCl 20 mEq/L infusion (has no administration in time range)  fentaNYL (SUBLIMAZE) injection 25 mcg (has no administration in time range)  enoxaparin (LOVENOX) injection 40 mg (has no administration in time range)  lactated ringers bolus 1,000 mL (0 mLs Intravenous Stopped 11/11/17 1248)  ondansetron (ZOFRAN) injection 4 mg (4 mg Intravenous Given 11/11/17 1144)  oxyCODONE-acetaminophen (PERCOCET/ROXICET) 5-325 MG per tablet 2 tablet (2 tablets Oral Given 11/11/17 1143)  fentaNYL (SUBLIMAZE) injection 50 mcg (50 mcg Intravenous Given 11/11/17 1359)  cephALEXin (KEFLEX) capsule 500 mg (500 mg Oral Given 11/11/17 1358)  metroNIDAZOLE (FLAGYL) tablet 500 mg (500 mg Oral Given 11/11/17 1358)     Initial Impression / Assessment and Plan / ED Course  I have reviewed the triage vital signs and the nursing notes.  Pertinent labs & imaging results that were available during my care of the patient were reviewed by me and considered in my medical decision making (see chart for details).  Clinical Course as of Nov 12 1599  Thu Nov 11, 2017  1105 Kristen Valentine presents with severe abdominal pain and inability to tolerate p.o. since Sunday.  She is focally tender in the epigastric region.  The differential for her includes UTI, pancreatitis, diverticulitis, gastritis, gallbladder  pathology, and appendicitis.  Clinically, she has no right lower quadrant tenderness or right upper quadrant tenderness.  I have a low suspicion for appendicitis or acute cholecystitis.   [NA]  1108 Lipase(!): 709 [NA]  1108 AST(!): 83 [NA]  1108 ALT(!): 192 [NA]  1108 Alkaline Phosphatase(!): 244 [NA]  1108 She has a transaminsemia with a lipase elevated into the 700s.  Her bilirubin is also elevated.  I am concerned for gallstone pancreatitis.  Abdominal ultrasound, IV fluids, Zofran, and oxycodone ordered.  Total Bilirubin(!): 2.2 [NA]  1109 Total Bilirubin(!): 2.2 [NA]  1315 Her abdominal ultrasound revealed cholelithiasis but no other acute abnormalities.  I discussed her care with gastroenterology Lianne Bushy).  We will obtain an MRCP, discusse her care with general surgery, and plan for admission to the hospitalist service.   [NA]  1335 I discussed her care with general surgery. They will evaluate her.  [NA]  1352 She will be admitted to the floor.  Prior to admission, I reviewed her labs again.  Her urine resulted as positive for trichomonas with a few bacteria.  Her UA also reveals positive nitrites and moderate leukocyte esterase.  She was given a dose of Keflex and Flagyl in the ED for possible UTI and trichomoniasis.   In the ED, she was given LR, oxycodone, fentanyl, and zofran.  She was made NPO.  [NA]    Clinical Course User Index [NA] Talitha Givens, MD     Final Clinical Impressions(s) / ED Diagnoses   Final diagnoses:  Acute pancreatitis, unspecified complication status, unspecified pancreatitis type  Trichomoniasis  Acute cystitis with hematuria    ED Discharge Orders    None       Talitha Givens, MD 11/11/17 1604    Loren Racer, MD 11/12/17 [phone]

## 2017-11-11 NOTE — ED Notes (Signed)
Called MRI to ask about IV placement and contrast. They are fine with a 20 g in the hand.

## 2017-11-11 NOTE — ED Notes (Signed)
Pt returned from xray

## 2017-11-11 NOTE — H&P (Signed)
Date: 11/11/2017               Patient Name:  Kristen Valentine MRN: [id number]  DOB: April 01, 1990 Age / Sex: 27 y.o., female   PCP: Patient, No Pcp Per         Medical Service: Internal Medicine Teaching Service         Attending Physician: Dr. Annia Belt    First Contact: Dr. Sherry Ruffing Pager: [phone]  Second Contact: Dr. 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 She reports that the pain started on Sunday, located in the epigastric area, intermittent radiation to the back, is constant with a general soreness with intermittent episodes of sharp pain, exacerbated by movement, nothing seems to alleviate it. Associated with nausea, non-bilious non-bloody emesis, and decreased appetite. She also reported a change in the color of her urine to a orange color. She reports that this is the first time that this has happened. She denies any recent fevers, chills, headaches, or chest pain. Denies any recent sick contacts, abdominal trauma, no history of any recent bites. No recent changes in diet. No family history of gall stones. She denied any EtOH or drug use. She has had no abdominal surgeries and does not take any medications.   In the ED she was found to be afebrile, tachycardic with a HR of 114, RR 16, normotensive. Lipase was elevated to 709. CMP showed K 3.4. AST 83, ALT 192, Alk phos 244, total bilirubin 2.2. CBC showed WBC 8.8, RBC 5.38, Hgb 12.2, MCV 72.1, Plt 406. U/A showed +Nitrites, 11-20 WBCs, >80 ketones, few bacteria and trichomonas was present. She had a abdominal ultrasound that showed cholelithiasis with no gallbladder wall thickening or pericholecystic fluid. She was given 2L LR,  Zofran and 2 Percocets. Surgery and GI was consulted. Patient was admitted to internal medicine.   Meds:  Scheduled Meds: . enoxaparin (LOVENOX) injection  40 mg Subcutaneous Q24H   Continuous Infusions: . lactated ringers with KCl 20 mEq/L 250 mL/hr at 11/11/17 1645   PRN Meds:.fentaNYL (SUBLIMAZE) injection  No outpatient medications have been marked as taking for the 11/11/17 encounter [facility] Encounter).    Allergies: Allergies as of 11/11/2017  . (No Known Allergies)   Past Medical History:  Diagnosis Date  . Chlamydia infection affecting pregnancy 01/07/2016  . Pancreatitis 11/11/2017    Family History: Grandmother has HTN and lung cancer. Father has DVT. No history of gallstones, diabetes, or heart issues.   Social History: Denies EtOH, tobacco, or drug use.   Review of Systems: A complete ROS was negative except as per HPI.   Physical Exam: Blood pressure (!) 101/52, pulse 88, temperature 98.1 F (36.7 C), temperature source Oral, resp. rate 18, last menstrual period 11/06/2017, SpO2 97 %, unknown if currently breastfeeding. General: alert, cooperative, appears stated age and no distress HEENT: PERRLA and neck supple with midline trachea Heart: S1, S2 normal, no murmur, rub or gallop, regular rate and rhythm Lungs: clear to auscultation, no wheezes or rales and unlabored breathing Abdomen: moderate tenderness in the in the epigastrium., no rebound tenderness, no guarding or rigidity, no hernias noted Extremities: extremities normal, atraumatic, no cyanosis or  edema Musculoskeletal: no joint tenderness, deformity or swelling Skin:no rashes, no wounds Neurology: normal without focal findings, mental status, speech normal, alert and oriented x3, PERLA and reflexes normal and symmetric Psychiatry: Normal mood and affect  11/11/17 Abdominal U/S IMPRESSION: Cholelithiasis. No gallbladder wall thickening or pericholecystic fluid. Study otherwise  unremarkable.  Assessment & Plan by Problem: This is a 27 year old female with no significant past medical history who presented with abdominal pain and found to have signs and symptoms of pancreatitis.   Pancreatitis likely 2/2 gallstones: Patient has had sudden onset of epigastric abdominal pain that is sharp in nature, intermittent radiation to the back, worse with movement, associated with nausea, emesis and decreased appetite. No known inciting factors such as EtOH use, trauma, history of hyperglycemia, family history, no recent changes in diet or new medications. On exam she was found to be tachycardic with epigastric tenderness. Her labs were significant for a lipase of 709, with elevated AST 83, ALT 192, and Alk phos of 244. No signs of infection or evidence of trauma. She had a ultrasound that showed cholilithiasis with no evidence of gallbladder wall thickening or enlarged common bile duct. Given these findings this is likely pancreatitis. It was probably caused by a gallstone that may have passes already.  - S/p keflex and flagyl.  -Surgery consult, recommend cholecystectomy after treatment of pancreatitis -Gastroenterology consult -NPO pending evaluations -LR at 250 mL/hr -Fentanyl q1 hour for pain -Repeat CMP in AM -Repeat Lipase in AM -Zofran PRN  UTI: She reported some change in color of urine, orange in color. Denied any dysuria or change in frequency. U/A showed +Nitrites, 11-20 WBCs, >80 ketones, few bacteria and trichomonas was present.  -S/p flagyl in ED  Hypokalemia: K was 3.4 on admission. Denied any muscle pains.  -On LR 250 cc/hr -Recheck BMP in AM  FEN: NS 250cc/hr, replete lytes prn, NPO VTE ppx: Lovenox  Code Status: FULL   Dispo: Admit patient to Inpatient with expected length of stay greater than 2 midnights.  Signed: Asencion Noble, MD 11/11/2017, 6:42 PM  Pager: [phone]

## 2017-11-11 NOTE — ED Notes (Signed)
Patient transported to MRI 

## 2017-11-11 NOTE — ED Notes (Signed)
Urine culture sent to lab with UA. 

## 2017-11-11 NOTE — Consult Note (Signed)
Kristen Valentine 06-07-1990  [id number].    Requesting MD: Dr. Lalla Brothers Chief Complaint/Reason for Consult: gallstone pancreatitis  HPI:  This is a 27 yo obese black female with no past medical history who began having epigastric abdominal pain on Sunday.  She denies any RUQ abdominal pain.  She started to develop some nausea and vomiting on Monday.  Throughout the week, her pain began to worsen.  It got really bad yesterday.  She denies any diarrhea.  She last had a BM yesterday.  She denies fevers, chills, itching, CP, SOB, dysuria, etc.  She does admit her urine changed to an orange color yesterday.  She has not been eating or drinking much given her nausea and vomiting.  Due to persistent pain, she presented today to the ED where she had an Korea that revealed gallstones, but no evidence of cholecystitis.  Her CBD is normal at 27m with no evidence of choledocholithiasis.  However, her labs show elevation of her lipase at 709.  Her TB is 2.2 with some elevation of her other LFTs as well.  She is being admitted by medicine and we have been asked to see her for surgical intervention once her pancreatitis resolves.   ROS: ROS: Please see HPI, otherwise all other systems have been reviewed and are negative.  History reviewed. No pertinent family history.      Past Medical History:  Diagnosis Date  . Chlamydia infection affecting pregnancy 01/07/2016    History reviewed. No pertinent surgical history.  Social History:  reports that she has never smoked. She has never used smokeless tobacco. She reports that she does not drink alcohol or use drugs.  Allergies: No Known Allergies   (Not in a hospital admission)   Physical Exam: Blood pressure 115/73, pulse 91, temperature 98.7 F (37.1 C), temperature source Oral, resp. rate 16, SpO2 100 %, unknown if currently breastfeeding. General: quiet, obese, black, female who is laying in bed in NAD HEENT: head is  normocephalic, atraumatic.  Sclera are noninjected.  PERRL.  Ears and nose without any masses or lesions.  Mouth is pink and dry Heart: regular, rate, and rhythm.  Normal s1,s2. No obvious murmurs, gallops, or rubs noted.  Palpable radial and pedal pulses bilaterally Lungs: CTAB, no wheezes, rhonchi, or rales noted.  Respiratory effort nonlabored Abd: soft, mild epigastric tenderness, obese, +BS, no masses, hernias, or organomegaly MS: all 4 extremities are symmetrical with no cyanosis, clubbing, or edema. Skin: warm and dry with no masses, lesions, or rashes Psych: A&Ox3 with an appropriate affect.   LabResultsLast48Hours        Results for orders placed or performed during the hospital encounter of 11/11/17 (from the past 48 hour(s))  Lipase, blood     Status: Abnormal   Collection Time: 11/11/17  8:01 AM  Result Value Ref Range   Lipase 709 (H) 11 - 51 U/L    Comment: RESULTS CONFIRMED BY MANUAL DILUTION Performed at [facility] [address], [place] [address]  Comprehensive metabolic panel     Status: Abnormal   Collection Time: 11/11/17  8:01 AM  Result Value Ref Range   Sodium 139 135 - 145 mmol/L   Potassium 3.4 (L) 3.5 - 5.1 mmol/L   Chloride 102 98 - 111 mmol/L   CO2 24 22 - 32 mmol/L   Glucose, Bld 100 (H) 70 - 99 mg/dL   BUN 5 (L) 6 - 20 mg/dL   Creatinine,  Ser 0.85 0.44 - 1.00 mg/dL   Calcium 9.5 8.9 - 10.3 mg/dL   Total Protein 8.5 (H) 6.5 - 8.1 g/dL   Albumin 3.6 3.5 - 5.0 g/dL   AST 83 (H) 15 - 41 U/L   ALT 192 (H) 0 - 44 U/L   Alkaline Phosphatase 244 (H) 38 - 126 U/L   Total Bilirubin 2.2 (H) 0.3 - 1.2 mg/dL   GFR calc non Af Amer >60 >60 mL/min   GFR calc Af Amer >60 >60 mL/min    Comment: (NOTE) The eGFR has been calculated using the CKD EPI equation. This calculation has not been validated in all clinical situations. eGFR's persistently <60 mL/min signify possible Chronic Kidney Disease.    Anion gap  13 5 - 15    Comment: Performed at [facility] [address], [place] [address]  CBC     Status: Abnormal   Collection Time: 11/11/17  8:01 AM  Result Value Ref Range   WBC 8.8 4.0 - 10.5 K/uL   RBC 5.38 (H) 3.87 - 5.11 MIL/uL   Hemoglobin 12.2 12.0 - 15.0 g/dL   HCT 38.8 36.0 - 46.0 %   MCV 72.1 (L) 78.0 - 100.0 fL   MCH 22.7 (L) 26.0 - 34.0 pg   MCHC 31.4 30.0 - 36.0 g/dL   RDW 15.7 (H) 11.5 - 15.5 %   Platelets 406 (H) 150 - 400 K/uL    Comment: Performed at [facility], [place] [address], [place], [place] [address]  I-Stat beta hCG blood, ED     Status: None   Collection Time: 11/11/17  8:13 AM  Result Value Ref Range   I-stat hCG, quantitative <5.0 <5 mIU/mL   Comment 3            Comment:   GEST. AGE      CONC.  (mIU/mL)   <=1 WEEK        5 - 50     2 WEEKS       50 - 500     3 WEEKS       100 - 10,000     4 WEEKS     1,000 - 30,000        FEMALE AND NON-PREGNANT FEMALE:     LESS THAN 5 mIU/mL   Urinalysis, Routine w reflex microscopic     Status: Abnormal   Collection Time: 11/11/17 10:27 AM  Result Value Ref Range   Color, Urine AMBER (A) YELLOW    Comment: BIOCHEMICALS MAY BE AFFECTED BY COLOR   APPearance TURBID (A) CLEAR   Specific Gravity, Urine 1.025 1.005 - 1.030   pH 5.5 5.0 - 8.0   Glucose, UA NEGATIVE NEGATIVE mg/dL   Hgb urine dipstick TRACE (A) NEGATIVE   Bilirubin Urine MODERATE (A) NEGATIVE   Ketones, ur >80 (A) NEGATIVE mg/dL   Protein, ur 100 (A) NEGATIVE mg/dL   Nitrite POSITIVE (A) NEGATIVE   Leukocytes, UA MODERATE (A) NEGATIVE    Comment: Performed at [facility], [place] [address] [place], [place] [address]  Urinalysis, Microscopic (reflex)     Status: Abnormal   Collection Time: 11/11/17 10:27 AM  Result Value Ref Range   RBC / HPF 0-5 0 - 5 RBC/hpf   WBC, UA 11-20 0 - 5 WBC/hpf   Bacteria, UA FEW (A) NONE SEEN   Squamous Epithelial / LPF 6-10 0 - 5   Trichomonas, UA  PRESENT (A)  NONE SEEN   Hyaline Casts, UA PRESENT     Comment: Performed at [facility], [place] [address], [place], [place] [address]      ImagingResults(Last48hours)  US Abdomen Limited  Result Date: 11/11/2017 CLINICAL DATA:  Upper abdominal pain EXAM: ULTRASOUND ABDOMEN LIMITED RIGHT UPPER QUADRANT COMPARISON:  None. FINDINGS: Gallbladder: Within the gallbladder, there are echogenic foci which move and shadow consistent with cholelithiasis. Largest gallstone measures 1.3 cm in length. There is no appreciable gallbladder wall thickening or pericholecystic fluid. No sonographic Murphy sign noted by sonographer. Common bile duct: Diameter: 4 mm. No intrahepatic or extrahepatic biliary duct dilatation. Liver: No focal lesion identified. Within normal limits in parenchymal echogenicity. Portal vein is patent on color Doppler imaging with normal direction of blood flow towards the liver. IMPRESSION: Cholelithiasis. No gallbladder wall thickening or pericholecystic fluid. Study otherwise unremarkable. Electronically Signed   By: Lowella Grip III M.D.   On: 11/11/2017 12:06       Assessment/Plan Gallstone pancreatitis The patient appears to have gallstone pancreatitis.  Her LFTs are elevated, but her Korea does not show a dilated CBD which only measures 19m.  From her history, I suspect she has passed a stone already.  I think it is reasonable to bypass an MRCP at this time and just follow her labs.  She will need aggressive IVF hydration to help her pancreatitis.  We will follow her lipase level, but more importantly her clinical exam.  If her pain continues to improve, then she may be ready for a lap chole as soon as tomorrow.  However, if she worsens or her labs go up then surgery may be delayed. She will be NPO x ice chips for today and NPO p MN for possible OR.  This plan has been discussed with her and she understands and agrees.   UTI Per primary service  FEN - Npo x ice  today, NPO p MN VTE - SCDs/Lovenox ID - Keflex/Flagyl x1 dose, no abx therapy needed for gs pancreatitis at this time.  KHenreitta Cea [facility] 11/11/2017, 3:08 PM  Pager: [phone]

## 2017-11-11 NOTE — ED Triage Notes (Signed)
Patient complains of abdominal pain, exacerbated by movement since Sunday. Also reports emesis, denies diarrhea. Afebrile in triage. Patient alert, oriented, and ambulating independently with steady gait.

## 2017-11-12 ENCOUNTER — Inpatient Hospital Stay (HOSPITAL_COMMUNITY): Payer: Medicaid Other

## 2017-11-12 ENCOUNTER — Inpatient Hospital Stay (HOSPITAL_COMMUNITY): Payer: Medicaid Other | Admitting: Certified Registered"

## 2017-11-12 ENCOUNTER — Encounter (HOSPITAL_COMMUNITY): Payer: Self-pay | Admitting: *Deleted

## 2017-11-12 ENCOUNTER — Encounter (HOSPITAL_COMMUNITY)
Admission: EM | Disposition: A | Discharge status: Home / Self Care | Payer: Self-pay | Source: Home / Self Care | Attending: Student in an Organized Health Care Education/Training Program

## 2017-11-12 DIAGNOSIS — K851 Biliary acute pancreatitis without necrosis or infection: Principal | ICD-10-CM

## 2017-11-12 DIAGNOSIS — K8020 Calculus of gallbladder without cholecystitis without obstruction: Secondary | ICD-10-CM

## 2017-11-12 DIAGNOSIS — Z9049 Acquired absence of other specified parts of digestive tract: Secondary | ICD-10-CM

## 2017-11-12 HISTORY — PX: CHOLECYSTECTOMY: SHX55

## 2017-11-12 LAB — COMPREHENSIVE METABOLIC PANEL
ALBUMIN: 2.8 g/dL — AB (ref 3.5–5.0)
ALK PHOS: 192 U/L — AB (ref 38–126)
ALT: 124 U/L — ABNORMAL HIGH (ref 0–44)
ANION GAP: 10 (ref 5–15)
AST: 54 U/L — ABNORMAL HIGH (ref 15–41)
BUN: 5 mg/dL — ABNORMAL LOW (ref 6–20)
CALCIUM: 8.8 mg/dL — AB (ref 8.9–10.3)
CHLORIDE: 103 mmol/L (ref 98–111)
CO2: 24 mmol/L (ref 22–32)
Creatinine, Ser: 0.74 mg/dL (ref 0.44–1.00)
GFR calc non Af Amer: >60 mL/min (ref >60)
Glucose, Bld: 71 mg/dL (ref 70–99)
POTASSIUM: 3.8 mmol/L (ref 3.5–5.1)
Sodium: 137 mmol/L (ref 135–145)
Total Bilirubin: 2.5 mg/dL — ABNORMAL HIGH (ref 0.3–1.2)
Total Protein: 6.7 g/dL (ref 6.5–8.1)

## 2017-11-12 LAB — HIV ANTIBODY (ROUTINE TESTING W REFLEX): HIV Screen 4th Generation wRfx: NONREACTIVE

## 2017-11-12 LAB — SURGICAL PCR SCREEN
MRSA, PCR: NEGATIVE
Staphylococcus aureus: NEGATIVE

## 2017-11-12 LAB — LIPASE, BLOOD: LIPASE: 111 U/L — AB (ref 11–51)

## 2017-11-12 SURGERY — LAPAROSCOPIC CHOLECYSTECTOMY WITH INTRAOPERATIVE CHOLANGIOGRAM
Anesthesia: General | Site: Abdomen

## 2017-11-12 MED ORDER — PHENYLEPHRINE 40 MCG/ML (10ML) SYRINGE FOR IV PUSH (FOR BLOOD PRESSURE SUPPORT)
PREFILLED_SYRINGE | INTRAVENOUS | Status: DC | PRN
  Administered 2017-11-12 (×2): 80 ug via INTRAVENOUS

## 2017-11-12 MED ORDER — SUGAMMADEX SODIUM 500 MG/5ML IV SOLN
INTRAVENOUS | Status: AC
  Filled 2017-11-12: qty 5

## 2017-11-12 MED ORDER — CEFAZOLIN SODIUM 1 G IJ SOLR
INTRAMUSCULAR | Status: AC
  Filled 2017-11-12: qty 10

## 2017-11-12 MED ORDER — DEXAMETHASONE SODIUM PHOSPHATE 10 MG/ML IJ SOLN
INTRAMUSCULAR | Status: AC
  Filled 2017-11-12: qty 1

## 2017-11-12 MED ORDER — KETOROLAC TROMETHAMINE 30 MG/ML IJ SOLN
INTRAMUSCULAR | Status: DC | PRN
  Administered 2017-11-12: 30 mg via INTRAVENOUS

## 2017-11-12 MED ORDER — CEFAZOLIN SODIUM-DEXTROSE 2-4 GM/100ML-% IV SOLN
2.0000 g | INTRAVENOUS | Status: AC
  Administered 2017-11-12: 2 g via INTRAVENOUS
  Filled 2017-11-12: qty 100

## 2017-11-12 MED ORDER — SODIUM CHLORIDE 0.9 % IR SOLN
Status: DC | PRN
Start: 2017-11-12 — End: 2017-11-12
  Administered 2017-11-12: 1000 mL

## 2017-11-12 MED ORDER — BUPIVACAINE-EPINEPHRINE (PF) 0.25% -1:200000 IJ SOLN
INTRAMUSCULAR | Status: AC
  Filled 2017-11-12: qty 30

## 2017-11-12 MED ORDER — ROCURONIUM BROMIDE 50 MG/5ML IV SOSY
PREFILLED_SYRINGE | INTRAVENOUS | Status: AC
  Filled 2017-11-12: qty 5

## 2017-11-12 MED ORDER — HYDROMORPHONE HCL 1 MG/ML IJ SOLN
0.5000 mg | INTRAMUSCULAR | Status: AC | PRN
  Administered 2017-11-12 (×3): 0.5 mg via INTRAVENOUS
  Filled 2017-11-12 (×3): qty 1

## 2017-11-12 MED ORDER — BUPIVACAINE-EPINEPHRINE 0.25% -1:200000 IJ SOLN
INTRAMUSCULAR | Status: DC | PRN
  Administered 2017-11-12: 10 mL

## 2017-11-12 MED ORDER — FENTANYL CITRATE (PF) 100 MCG/2ML IJ SOLN
25.0000 ug | INTRAMUSCULAR | Status: DC | PRN
  Administered 2017-11-12: 25 ug via INTRAVENOUS
  Filled 2017-11-12: qty 2

## 2017-11-12 MED ORDER — SUCCINYLCHOLINE CHLORIDE 200 MG/10ML IV SOSY
PREFILLED_SYRINGE | INTRAVENOUS | Status: DC | PRN
  Administered 2017-11-12: 120 mg via INTRAVENOUS

## 2017-11-12 MED ORDER — PROPOFOL 10 MG/ML IV BOLUS
INTRAVENOUS | Status: DC | PRN
  Administered 2017-11-12: 200 mg via INTRAVENOUS

## 2017-11-12 MED ORDER — FENTANYL CITRATE (PF) 250 MCG/5ML IJ SOLN
INTRAMUSCULAR | Status: AC
  Filled 2017-11-12: qty 5

## 2017-11-12 MED ORDER — DEXAMETHASONE SODIUM PHOSPHATE 10 MG/ML IJ SOLN
INTRAMUSCULAR | Status: DC | PRN
  Administered 2017-11-12: 10 mg via INTRAVENOUS

## 2017-11-12 MED ORDER — ONDANSETRON HCL 4 MG/2ML IJ SOLN
INTRAMUSCULAR | Status: DC | PRN
  Administered 2017-11-12: 4 mg via INTRAVENOUS

## 2017-11-12 MED ORDER — CEFAZOLIN SODIUM-DEXTROSE 1-4 GM/50ML-% IV SOLN
INTRAVENOUS | Status: DC | PRN
  Administered 2017-11-12: 1 g via INTRAVENOUS

## 2017-11-12 MED ORDER — PROPOFOL 10 MG/ML IV BOLUS
INTRAVENOUS | Status: AC
  Filled 2017-11-12: qty 20

## 2017-11-12 MED ORDER — 0.9 % SODIUM CHLORIDE (POUR BTL) OPTIME
TOPICAL | Status: DC | PRN
  Administered 2017-11-12: 1000 mL

## 2017-11-12 MED ORDER — HYDROMORPHONE HCL 1 MG/ML IJ SOLN
0.5000 mg | INTRAMUSCULAR | Status: AC | PRN
  Administered 2017-11-12 – 2017-11-13 (×3): 0.5 mg via INTRAVENOUS
  Filled 2017-11-12 (×3): qty 1

## 2017-11-12 MED ORDER — ROCURONIUM BROMIDE 10 MG/ML (PF) SYRINGE
PREFILLED_SYRINGE | INTRAVENOUS | Status: DC | PRN
  Administered 2017-11-12: 50 mg via INTRAVENOUS

## 2017-11-12 MED ORDER — SUCCINYLCHOLINE CHLORIDE 200 MG/10ML IV SOSY
PREFILLED_SYRINGE | INTRAVENOUS | Status: AC
  Filled 2017-11-12: qty 10

## 2017-11-12 MED ORDER — ONDANSETRON HCL 4 MG/2ML IJ SOLN
INTRAMUSCULAR | Status: AC
  Filled 2017-11-12: qty 2

## 2017-11-12 MED ORDER — STERILE WATER FOR INJECTION IJ SOLN
INTRAMUSCULAR | Status: DC | PRN
  Administered 2017-11-12: 200 mL

## 2017-11-12 MED ORDER — LIDOCAINE 2% (20 MG/ML) 5 ML SYRINGE
INTRAMUSCULAR | Status: AC
  Filled 2017-11-12: qty 5

## 2017-11-12 MED ORDER — PHENYLEPHRINE 40 MCG/ML (10ML) SYRINGE FOR IV PUSH (FOR BLOOD PRESSURE SUPPORT)
PREFILLED_SYRINGE | INTRAVENOUS | Status: AC
  Filled 2017-11-12: qty 10

## 2017-11-12 MED ORDER — MIDAZOLAM HCL 5 MG/5ML IJ SOLN
INTRAMUSCULAR | Status: DC | PRN
  Administered 2017-11-12: 2 mg via INTRAVENOUS

## 2017-11-12 MED ORDER — MIDAZOLAM HCL 2 MG/2ML IJ SOLN
INTRAMUSCULAR | Status: AC
  Filled 2017-11-12: qty 2

## 2017-11-12 MED ORDER — IOPAMIDOL (ISOVUE-300) INJECTION 61%
INTRAVENOUS | Status: AC
  Filled 2017-11-12: qty 50

## 2017-11-12 MED ORDER — METOPROLOL TARTRATE 5 MG/5ML IV SOLN
INTRAVENOUS | Status: AC
  Filled 2017-11-12: qty 5

## 2017-11-12 MED ORDER — FENTANYL CITRATE (PF) 250 MCG/5ML IJ SOLN
INTRAMUSCULAR | Status: DC | PRN
  Administered 2017-11-12: 100 ug via INTRAVENOUS
  Administered 2017-11-12 (×3): 50 ug via INTRAVENOUS

## 2017-11-12 MED ORDER — SODIUM CHLORIDE 0.9 % IV SOLN
INTRAVENOUS | Status: DC | PRN
  Administered 2017-11-12: 100 mL

## 2017-11-12 MED ORDER — SUGAMMADEX SODIUM 200 MG/2ML IV SOLN
INTRAVENOUS | Status: DC | PRN
  Administered 2017-11-12: 247 mg via INTRAVENOUS

## 2017-11-12 MED ORDER — LACTATED RINGERS IV SOLN
INTRAVENOUS | Status: DC
  Administered 2017-11-12 (×3): via INTRAVENOUS

## 2017-11-12 SURGICAL SUPPLY — 40 items
APPLIER CLIP 5 13 M/L LIGAMAX5 (MISCELLANEOUS) ×2
BLADE CLIPPER SURG (BLADE) IMPLANT
CANISTER SUCT 3000ML PPV (MISCELLANEOUS) ×2 IMPLANT
CHLORAPREP W/TINT 26ML (MISCELLANEOUS) ×2 IMPLANT
CLIP APPLIE 5 13 M/L LIGAMAX5 (MISCELLANEOUS) ×1 IMPLANT
CLOSURE STERI-STRIP 1/4X4 (GAUZE/BANDAGES/DRESSINGS) ×2 IMPLANT
COVER MAYO STAND STRL (DRAPES) ×2 IMPLANT
COVER SURGICAL LIGHT HANDLE (MISCELLANEOUS) ×2 IMPLANT
DERMABOND ADVANCED (GAUZE/BANDAGES/DRESSINGS) ×1
DERMABOND ADVANCED .7 DNX12 (GAUZE/BANDAGES/DRESSINGS) ×1 IMPLANT
DRAPE C-ARM 42X72 X-RAY (DRAPES) ×2 IMPLANT
ELECT REM PT RETURN 9FT ADLT (ELECTROSURGICAL) ×2
ELECTRODE REM PT RTRN 9FT ADLT (ELECTROSURGICAL) ×1 IMPLANT
GLOVE BIO SURGEON STRL SZ7 (GLOVE) ×2 IMPLANT
GLOVE BIOGEL PI IND STRL 7.5 (GLOVE) ×1 IMPLANT
GLOVE BIOGEL PI INDICATOR 7.5 (GLOVE) ×1
GOWN STRL REUS W/ TWL LRG LVL3 (GOWN DISPOSABLE) ×3 IMPLANT
GOWN STRL REUS W/TWL LRG LVL3 (GOWN DISPOSABLE) ×3
GRASPER SUT TROCAR 14GX15 (MISCELLANEOUS) ×2 IMPLANT
KIT BASIN OR (CUSTOM PROCEDURE TRAY) ×2 IMPLANT
KIT TURNOVER KIT B (KITS) ×2 IMPLANT
NS IRRIG 1000ML POUR BTL (IV SOLUTION) ×2 IMPLANT
PAD ARMBOARD 7.5X6 YLW CONV (MISCELLANEOUS) ×2 IMPLANT
POUCH RETRIEVAL ECOSAC 10 (ENDOMECHANICALS) ×1 IMPLANT
POUCH RETRIEVAL ECOSAC 10MM (ENDOMECHANICALS) ×1
SCISSORS LAP 5X35 DISP (ENDOMECHANICALS) ×2 IMPLANT
SET CHOLANGIOGRAPH 5 50 .035 (SET/KITS/TRAYS/PACK) ×2 IMPLANT
SET IRRIG TUBING LAPAROSCOPIC (IRRIGATION / IRRIGATOR) ×2 IMPLANT
SLEEVE ENDOPATH XCEL 5M (ENDOMECHANICALS) ×4 IMPLANT
SPECIMEN JAR SMALL (MISCELLANEOUS) ×2 IMPLANT
STRIP CLOSURE SKIN 1/2X4 (GAUZE/BANDAGES/DRESSINGS) ×2 IMPLANT
SUT MNCRL AB 4-0 PS2 18 (SUTURE) ×2 IMPLANT
SUT VICRYL 0 UR6 27IN ABS (SUTURE) ×2 IMPLANT
TOWEL OR 17X24 6PK STRL BLUE (TOWEL DISPOSABLE) ×2 IMPLANT
TOWEL OR 17X26 10 PK STRL BLUE (TOWEL DISPOSABLE) ×2 IMPLANT
TRAY LAPAROSCOPIC MC (CUSTOM PROCEDURE TRAY) ×2 IMPLANT
TROCAR XCEL BLUNT TIP 100MML (ENDOMECHANICALS) ×2 IMPLANT
TROCAR XCEL NON-BLD 5MMX100MML (ENDOMECHANICALS) ×2 IMPLANT
TUBING INSUFFLATION (TUBING) ×2 IMPLANT
WATER STERILE IRR 1000ML POUR (IV SOLUTION) ×2 IMPLANT

## 2017-11-12 NOTE — Anesthesia Preprocedure Evaluation (Signed)
Anesthesia Evaluation  Patient identified by MRN, date of birth, ID band Patient awake    Reviewed: Allergy & Precautions, NPO status , Patient's Chart, lab work & pertinent test results  Airway Mallampati: II  TM Distance: >3 FB Neck ROM: Full    Dental  (+) Dental Advisory Given   Pulmonary neg pulmonary ROS,    breath sounds clear to auscultation       Cardiovascular negative cardio ROS   Rhythm:Regular Rate:Normal     Neuro/Psych negative neurological ROS     GI/Hepatic negative GI ROS, Neg liver ROS,   Endo/Other  Morbid obesity  Renal/GU negative Renal ROS     Musculoskeletal   Abdominal   Peds  Hematology negative hematology ROS (+)   Anesthesia Other Findings   Reproductive/Obstetrics                             Lab Results  Component Value Date   WBC 8.8 11/11/2017   HGB 12.2 11/11/2017   HCT 38.8 11/11/2017   MCV 72.1 (L) 11/11/2017   PLT 406 (H) 11/11/2017   Lab Results  Component Value Date   CREATININE 0.74 11/12/2017   BUN 5 (L) 11/12/2017   NA 137 11/12/2017   K 3.8 11/12/2017   CL 103 11/12/2017   CO2 24 11/12/2017    Anesthesia Physical Anesthesia Plan  ASA: III  Anesthesia Plan: General   Post-op Pain Management:    Induction: Intravenous  PONV Risk Score and Plan: 3 and Dexamethasone, Ondansetron, Midazolam and Treatment may vary due to age or medical condition  Airway Management Planned: Oral ETT  Additional Equipment:   Intra-op Plan:   Post-operative Plan: Extubation in OR  Informed Consent: I have reviewed the patients History and Physical, chart, labs and discussed the procedure including the risks, benefits and alternatives for the proposed anesthesia with the patient or authorized representative who has indicated his/her understanding and acceptance.   Dental advisory given  Plan Discussed with: CRNA  Anesthesia Plan Comments:          Anesthesia Quick Evaluation

## 2017-11-12 NOTE — Discharge Instructions (Signed)
Please arrive at least 30 min before your appointment to complete your check in paperwork.  If you are unable to arrive 30 min prior to your appointment time we may have to cancel or reschedule you.  LAPAROSCOPIC SURGERY: POST OP INSTRUCTIONS  1. DIET: Follow a light bland diet the first 24 hours after arrival home, such as soup, liquids, crackers, etc. Be sure to include lots of fluids daily. Avoid fast food or heavy meals as your are more likely to get nauseated. Eat a low fat the next few days after surgery.  2. Take your usually prescribed home medications unless otherwise directed. 3. PAIN CONTROL:  1. Pain is best controlled by a usual combination of three different methods TOGETHER:  i. Ice/Heat ii. Over the counter pain medication iii. Prescription pain medication 2. Most patients will experience some swelling and bruising around the incisions. Ice packs or heating pads (30-60 minutes up to 6 times a day) will help. Use ice for the first few days to help decrease swelling and bruising, then switch to heat to help relax tight/sore spots and speed recovery. Some people prefer to use ice alone, heat alone, alternating between ice & heat. Experiment to what works for you. Swelling and bruising can take several weeks to resolve.  3. It is helpful to take an over-the-counter pain medication regularly for the first few weeks. Choose one of the following that works best for you:  i. Naproxen (Aleve, etc) Two 220mg  tabs twice a day ii. Ibuprofen (Advil, etc) Three 200mg  tabs four times a day (every meal & bedtime) iii. Acetaminophen (Tylenol, etc) 500-650mg  four times a day (every meal & bedtime) 4. A prescription for pain medication (such as oxycodone, hydrocodone, etc) should be given to you upon discharge. Take your pain medication as prescribed.  i. If you are having problems/concerns with the prescription medicine (does not control pain, nausea, vomiting, rash, itching, etc), please call us [phone]  [phone] to see if we need to switch you to a different pain medicine that will work better for you and/or control your side effect better. ii. If you need a refill on your pain medication, please contact your pharmacy. They will contact our office to request authorization. Prescriptions will not be filled after 5 pm or on week-ends. 1. Avoid getting constipated. Between the surgery and the pain medications, it is common to experience some constipation. Increasing fluid intake and taking a fiber supplement (such as Metamucil, Citrucel, FiberCon, MiraLax, etc) 1-2 times a day regularly will usually help prevent this problem from occurring. A mild laxative (prune juice, Milk of Magnesia, MiraLax, etc) should be taken according to package directions if there are no bowel movements after 48 hours.  2. Watch out for diarrhea. If you have many loose bowel movements, simplify your diet to bland foods & liquids for a few days. Stop any stool softeners and decrease your fiber supplement. Switching to mild anti-diarrheal medications (Kayopectate, Pepto Bismol) can help. If this worsens or does not improve, please call us. 3. Wash / shower every day. You may shower over the dressings as they are waterproof. Continue to shower over incision(s) after the dressing is off. 4. Remove your waterproof bandages 5 days after surgery. You may leave the incision open to air. You may replace a dressing/Band-Aid to cover the incision for comfort if you wish.  5. ACTIVITIES as tolerated:  a. You may resume regular (light) daily activities beginning the next day--such as daily self-care, walking, climbing stairs--gradually  increasing activities as tolerated. If you can walk 30 minutes without difficulty, it is safe to try more intense activity such as jogging, treadmill, bicycling, low-impact aerobics, swimming, etc. °b. Save the most intensive and strenuous activity for last such as sit-ups, heavy lifting, contact sports, etc Refrain  from any heavy lifting or straining until you are off narcotics for pain control.  °c. DO NOT PUSH THROUGH PAIN. Let pain be your guide: If it hurts to do something, don't do it. Pain is your body warning you to avoid that activity for another week until the pain goes down. °d. You may drive when you are no longer taking prescription pain medication, you can comfortably wear a seatbelt, and you can safely maneuver your car and apply brakes. °e. You may have sexual intercourse when it is comfortable.  °6. FOLLOW UP in our office  °a. Please call [facility] at [phone] to set up an appointment to see your surgeon in the office for a follow-up appointment approximately 2-3 weeks after your surgery. °b. Make sure that you call for this appointment the day you arrive home to insure a convenient appointment time. °     10. IF YOU HAVE DISABILITY OR FAMILY LEAVE FORMS, BRING THEM TO THE               OFFICE FOR PROCESSING.  ° °WHEN TO CALL US [phone]:  °1. Poor pain control °2. Reactions / problems with new medications (rash/itching, nausea, etc)  °3. Fever over 101.5 F (38.5 C) °4. Inability to urinate °5. Nausea and/or vomiting °6. Worsening swelling or bruising °7. Continued bleeding from incision. °8. Increased pain, redness, or drainage from the incision ° °The clinic staff is available to answer your questions during regular business hours (8:30am-5pm). Please don’t hesitate to call and ask to speak to one of our nurses for clinical concerns.  °If you have a medical emergency, go to the nearest emergency room or call 911.  °A surgeon from [facility] is always on call at the hospitals  ° °[facility]  °[address], [address], [place], [place] [address] ?  °MAIN: [phone] ? TOLL FREE: [phone] ?  °FAX [phone]  °[email or web address] ° °

## 2017-11-12 NOTE — Progress Notes (Signed)
Pt continues to have uncontrolled pain despite two doses of 25 mcg fentanyl. Paged and talked with resident on call. Will wait for orders for something else to help control pain.

## 2017-11-12 NOTE — Anesthesia Procedure Notes (Signed)
Procedure Name: Intubation Date/Time: 11/12/2017 8:41 AM Performed by: Adria Dillonkin, Arieal Cuoco A, CRNA Pre-anesthesia Checklist: Patient identified, Emergency Drugs available, Suction available and Patient being monitored Patient Re-evaluated:Patient Re-evaluated prior to induction Oxygen Delivery Method: Circle system utilized Preoxygenation: Pre-oxygenation with 100% oxygen Induction Type: IV induction Laryngoscope Size: Miller and 2 Grade View: Grade I Tube type: Oral Number of attempts: 1 Airway Equipment and Method: Stylet Placement Confirmation: ETT inserted through vocal cords under direct vision,  positive ETCO2,  CO2 detector and breath sounds checked- equal and bilateral Secured at: 23 cm Tube secured with: Tape Dental Injury: Teeth and Oropharynx as per pre-operative assessment

## 2017-11-12 NOTE — Discharge Summary (Signed)
Name: Kristen Valentine MRN: [id number] DOB: 07/04/1990 27 y.o. PCP: Patient, No Pcp Per  Date of Admission: 11/11/2017  7:48 AM Date of Discharge: 11/14/2017 Attending Physician: Dr Oswaldo DoneVincent   Discharge Diagnosis: 1. Gallstone pancreatitis  S/p cholecystectomy  Discharge Medications: Allergies as of 11/14/2017   No Known Allergies     Medication List    STOP taking these medications   ferrous sulfate 325 (65 FE) MG tablet   ibuprofen 600 MG tablet Commonly known as:  ADVIL,MOTRIN   NIFEdipine 30 MG 24 hr tablet Commonly known as:  PROCARDIA-XL/ADALAT CC   prenatal multivitamin Tabs tablet     TAKE these medications   docusate sodium 100 MG capsule Commonly known as:  COLACE Take 1 capsule (100 mg total) by mouth 2 (two) times daily.   oxyCODONE 5 MG immediate release tablet Commonly known as:  Oxy IR/ROXICODONE Take 1-2 tablets (5-10 mg total) by mouth every 6 (six) hours as needed for severe pain.       Disposition and follow-up:   [facility]rist[facility]  At the hospital follow up visit please address:  1.  Gallstone pancreatitis s/p cholecystectomy - Intraoperative cholangiogram was complicated by difficulties with advancing contrast past the cystic duct therefore patency of the common bile duct wasn't confirmed intraoperatively. If Ms. Kristen Valentine has reoccurrence of pancreatitis she will need ERCP.    2.  Labs / imaging needed at time of follow-up: hepatic transaminase   3.  Pending labs/ test needing follow-up: none   Follow-up Appointments: Follow-up Information    [facility] Call in 2 week(s).   Specialty:  General Surgery Why:  We are working on your appointment, call to confirm. Contact information: [address] [place] [id number] [phone]           Hospital Course by problem list: 1. Gallstone pancreatitis  Ms. Kristen Valentine presented with symptoms of  pancreatitis including nausea and epigastric abdominal pain. Labs revealed lipase 700 with elevated transaminase and bilirubin in a pattern consistent with biliary obstruction. Ultrasound of the right upper quadrant revealed gallstones within the gallbladder without signs of gallbladder inflammation or common bile duct dilation. She was taken for cholecystectomy on the second day of surgery. Intraoperative cholangiogram was complicated by inability to advance contrast past the cystic duct. Elevated transaminase and bilirubin and abdominal pain resolved post op. She was able to tolerate a regular diet in the 48 hours prior to discharge without reoccurrence of the pain. The combination of initial ultrasound findings without common bile duct dilation and clinical improvement made ERCP unnecessary during this admission. General surgery scheduled follow up in their clinic one week after discharge. A work note was provided.   Discharge Vitals:   BP 127/70 (BP Location: Right Arm)   Pulse 62   Temp 98.3 F (36.8 C) (Oral)   Resp 18   Ht 5' 3.5" (1.613 m)   Wt 122.9 kg   LMP 11/06/2017   SpO2 98%   BMI 47.24 kg/m   Pertinent Labs, Studies, and Procedures:  [facility]  Discharge Instructions: Discharge Instructions    Call MD for:  persistant nausea and vomiting   Complete by:  As directed    Call MD for:  severe uncontrolled pain   Complete by:  As directed    Call MD for:  temperature >100.4   Complete by:  As directed    Diet - low  sodium heart healthy   Complete by:  As directed    Discharge instructions   Complete by:  As directed    You were hospitalized for pancreatitis from a gallstone. We removed your pancreas, we were not able to completely confirm that you dont have a stone left but the improvement in your symptoms and labwork is reassuring against this. If you were to redevelop similar symptoms of upper abdominal pain and nausea be sure to call our clinic or  go to an urgent care. Thank you for allowing us to be part of your care.   You will need to call the surgeons office to schedule a follow up appointment next week.   Please call the internal medicine center clinic if you have any questions or concerns, we may be able to help and keep you from a long and expensive emergency room wait. Our clinic and after hours phone number is [phone], the best time to call is Monday through Friday 9 am to 4 pm but there is always someone available 24/7 if you have an emergency.   Increase activity slowly   Complete by:  As directed       Signed: Eulah Valentine, Kristen Natividad, MD 11/14/2017, 3:45 PM   Pager: [phone]

## 2017-11-12 NOTE — Progress Notes (Signed)
   Subjective/Chief Complaint: No pain this am, no complaints   Objective: Vital signs in last 24 hours: Temp:  [98.1 F (36.7 C)-99.9 F (37.7 C)] 99.9 F (37.7 C) (08/16 0514) Pulse Rate:  [81-114] 106 (08/16 0514) Resp:  [16-18] 16 (08/16 0514) BP: (101-138)/(52-101) 124/69 (08/16 0514) SpO2:  [97 %-100 %] 98 % (08/16 0514) Weight:  [123.5 kg] 123.5 kg (08/16 0043) Last BM Date: 11/11/17  Intake/Output from previous day: 08/15 0701 - 08/16 0700 In: 3073.6 [I.V.:2073.6; IV Piggyback:1000] Out: -  Intake/Output this shift: No intake/output data recorded.  GI: soft nt/nd  Lab Results:  Recent Labs    11/11/17 0801  WBC 8.8  HGB 12.2  HCT 38.8  PLT 406*   BMET Recent Labs    11/11/17 0801 11/12/17 0404  NA 139 137  K 3.4* 3.8  CL 102 103  CO2 24 24  GLUCOSE 100* 71  BUN 5* 5*  CREATININE 0.85 0.74  CALCIUM 9.5 8.8*   PT/INR No results for input(s): LABPROT, INR in the last 72 hours. ABG No results for input(s): PHART, HCO3 in the last 72 hours.  Invalid input(s): PCO2, PO2  Studies/Results: [place] Abdomen Limited  Result Date: 11/11/2017 CLINICAL DATA:  Upper abdominal pain EXAM: ULTRASOUND ABDOMEN LIMITED RIGHT UPPER QUADRANT COMPARISON:  None. FINDINGS: Gallbladder: Within the gallbladder, there are echogenic foci which move and shadow consistent with cholelithiasis. Largest gallstone measures 1.3 cm in length. There is no appreciable gallbladder wall thickening or pericholecystic fluid. No sonographic Murphy sign noted by sonographer. Common bile duct: Diameter: 4 mm. No intrahepatic or extrahepatic biliary duct dilatation. Liver: No focal lesion identified. Within normal limits in parenchymal echogenicity. Portal vein is patent on color Doppler imaging with normal direction of blood flow towards the liver. IMPRESSION: Cholelithiasis. No gallbladder wall thickening or pericholecystic fluid. Study otherwise unremarkable. Electronically Signed   By: Bretta BangWilliam   Woodruff III M.D.   On: 11/11/2017 12:06    Anti-infectives: Anti-infectives (From admission, onward)   Start     Dose/Rate Route Frequency Ordered Stop   11/12/17 0800  ceFAZolin (ANCEF) IVPB 2g/100 mL premix     2 g 200 mL/hr over 30 Minutes Intravenous  Once 11/12/17 0748     11/11/17 1400  cephALEXin (KEFLEX) capsule 500 mg     500 mg Oral  Once 11/11/17 1353 11/11/17 1358   11/11/17 1400  metroNIDAZOLE (FLAGYL) tablet 500 mg     500 mg Oral  Once 11/11/17 1353 11/11/17 1358      Assessment/Plan: GSP, resolved  lfts and lipase upa little still, clinically normal today. Will do lap chole and cholangiogram today.   The patient was given [employer] engineered[employer] material.  We discussed the risks and benefits of a laparoscopic cholecystectomy and possible cholangiogram including, but not limited to bleeding, infection, injury to surrounding structures such as the intestine or liver, bile leak, retained gallstones, need to convert to an open procedure, prolonged diarrhea, blood clots such as  DVT, common bile duct injury, anesthesia risks, and possible need for additional procedures.  The likelihood of improvement in symptoms and return to the patient's normal status is good. We discussed the typical post-operative recovery course.   Kristen Valentine 11/12/2017

## 2017-11-12 NOTE — Progress Notes (Addendum)
   Subjective: She was still having abdominal pain overnight, she was started on dialudid q2 hours for the pain. She was taken to surgery this morning and had her gall bladder removed. Was seen after the surgery and she reports feeling good. She reports that the abdominal pain that she came in with improved, now there is only pain around the surgical scars. She denied any nausea, vomiting, fevers, or chills. She has not had a bowel movement yet. She does report an appetite and would like something to eat.   Objective:  Vital signs in last 24 hours: Vitals:   11/11/17 1525 11/11/17 1933 11/12/17 0043 11/12/17 0514  BP: (!) 101/52 121/76  124/69  Pulse: 88 81  (!) 106  Resp: _0 Temp: 98.1 F (36.7 C) 99.7 F (37.6 C)  99.9 F (37.7 C)  TempSrc: Oral Oral  Oral  SpO2: 97% 100%  98%  Weight:   123.5 kg   Height:   5' 3.5" (1.613 m)     General: Well nourished, well appearing, NAD  HEENT: Normocephalic, nontraumatic, midline trachea Cardiac: RRR, normal S1, S2, systolic murmur Pulmonary: Lungs CTA bilaterally, no wheezing, rhonchi or rales Abdomen: Soft, 3 surgical scars in RUQ and RLQ, and on below the umbilicus, tenderness around these areas, no epigastric tenderness, hypoactive bowel sounds,  Extremity: No LE edema, no muscle atrophy, no lesions or wounds noted  Neuro: Alert and oriented x3, PERRLA, moves all extremities Psychiatry: Normal mood and affect    Assessment/Plan: This is a 27 year old female with a 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 Admitted for pancreatitis.   Pancreatitis: 5 days history of abdominal pain, nausea, vomiting, and decreased appetite. Found to have a significantly elevated lipase to 709 and elevated AST, ALT, alk phos, and bilirubin. Her abdominal ultrasound showed chololithiasis but no evidence of choledocholithiasis. She was started on IV  hydration with LR and pain control. 8/16: Lipase 111, AST 54, ALT 124, Alk Phos 192, and T Bilil 2.5. Surgery did a cholecystectomy on 8/16, there was evidence of cholecystitis, and a small and friable cystic duct, the cholangiogram was unable to be obtained due to the duct tearing, there was successful removal of the gallbladder. She seems to be clinically improving today. -Surgery completed cholecystectomy -GI consult to consider MRCP or ERCP since intraop cholangiogram was unable to be obtained -Continue LR 250 mL/hr -Continue dialudid q2 hours PRN -Fentanyl PRN  -Clear liquid diet -Repeat CMP and lipase in AM  Hypokalemia: Patients K on admission was 3.4. She was started on LR at 290m/hr for treatment of pancraetitis. Today her K is 3.8. Resolved.  FEN: LR 250cc/hr, replete lytes prn, clear liquid VTE ppx: Lovenox (hold if surgery is done today) Code Status: FULL    Dispo: Anticipated discharge in approximately 1-2 day(s).   KAsencion Noble MD 11/12/2017, 6:42 AM Pager: [phone]

## 2017-11-12 NOTE — Op Note (Signed)
Preoperative diagnosis: gallstone pancreatitis, biliary colic Postoperative diagnosis: Same as above Procedure: Laparoscopic cholecystectomy with cholangiogram Surgeon: Dr. Harden MoMatt Franchon Ketterman Asst: Barnetta ChapelKelly Osborne, PA-c Estimated blood loss: minimal Anesthesia: General Specimens: Gallbladder and contents to pathology Complications: None Drains: None Sponge count was correct at completion Disposition to recovery stable condition  Indications: This a 27 year old female with biliary colic and cholelithiasis on an ultrasound. she had mildly elevated lipase and bilirubin.  We discussed laparoscopic cholecystectomy with cholangiogram  Procedure: After informed consent was obtained the patient was taken to the operating room. She was given antibiotics. SCDs were in place. She was placed under general anesthesia without complication. She was prepped and draped in the standard sterile surgical fashion. A surgical timeout was then performed.  I infiltrated Marcaine below the umbilicus. I made an incision and carried this to the fascia. I grasped the fascia with a kocher. I entered the fascia sharply and the peritoneum bluntly. There was no entry injury. I placed a 0 Vicryl pursestring suture and inserted a Hassan trocar. The abdomen was then insufflated to 15 mmHg pressure. I then inserted 3 further 5 mm trocars in the epigastrium and right side of the abdomen under direct vision without complication.I then was able to retract the gallbladder cephalad and lateral. 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   I dissected the triangle of Calotand obtain the critical view of safety. I then inserted a cholangiogram catheter. I made a ductotomy and inserted the catheter. The cystic duct was friable and small.  I clipped the catheter in placed and removing some small stones.  I was able to push contrast in the cystic duct but the had pressure and it came back.  I reinserted the catheter  and tried again. I tried to milk any more debris but didn't get any.  I think catheter was appropriately positioned and I aborted cholangiogram as the duct was tearing.  The common bile duct was able to be visualized. I then clipped the duct and divided it. The clips traversed the duct and this portion was viable. I then clipped the cystic artery in a similar fashion and divided that. I then remove the gallbladder from the liver bed. This was then placed in a bag and removed from the umbilicus. I then obtained hemostasis in the gallbladder bed. I then [facility] the [facility]. I tied down my pursestring. I used the suture closure device to place two additional 0 Vicryl sutures to completely obliterate the umbilical defect. I then remove the remaining trochars and desufflated the abdomen. These were closed with 4-0 Monocryl and glue was placed. She tolerated this well was extubated and transferred to recovery stable. Will plan on checking lfts in am. There is chance will need postop ercp

## 2017-11-12 NOTE — Transfer of Care (Signed)
Immediate Anesthesia Transfer of Care Note  Patient: Kristen Valentine  Procedure(s) Performed: LAPAROSCOPIC CHOLECYSTECTOMY WITH INTRAOPERATIVE CHOLANGIOGRAM (N/A Abdomen)  Patient Location: PACU  Anesthesia Type:General  Level of Consciousness: awake, drowsy and patient cooperative  Airway & Oxygen Therapy: Patient Spontanous Breathing and Patient connected to nasal cannula oxygen  Post-op Assessment: Report given to RN and Post -op Vital signs reviewed and stable  Post vital signs: Reviewed and stable  Last Vitals:  Vitals Value Taken Time  BP    Temp    Pulse 101 11/12/2017  9:51 AM  Resp 20 11/12/2017  9:51 AM  SpO2 96 % 11/12/2017  9:51 AM  Vitals shown include unvalidated device data.  Last Pain:  Vitals:   11/12/17 0818  TempSrc:   PainSc: 2          Complications: No apparent anesthesia complications

## 2017-11-13 DIAGNOSIS — E875 Hyperkalemia: Secondary | ICD-10-CM

## 2017-11-13 DIAGNOSIS — R011 Cardiac murmur, unspecified: Secondary | ICD-10-CM

## 2017-11-13 LAB — COMPREHENSIVE METABOLIC PANEL
ALK PHOS: 156 U/L — AB (ref 38–126)
ALT: 90 U/L — AB (ref 0–44)
AST: 56 U/L — ABNORMAL HIGH (ref 15–41)
Albumin: 2.6 g/dL — ABNORMAL LOW (ref 3.5–5.0)
Anion gap: 9 (ref 5–15)
BILIRUBIN TOTAL: 2.3 mg/dL — AB (ref 0.3–1.2)
BUN: <5 mg/dL — ABNORMAL LOW (ref 6–20)
CALCIUM: 8.9 mg/dL (ref 8.9–10.3)
CO2: 24 mmol/L (ref 22–32)
CREATININE: 0.62 mg/dL (ref 0.44–1.00)
Chloride: 104 mmol/L (ref 98–111)
Glucose, Bld: 97 mg/dL (ref 70–99)
Potassium: 6 mmol/L — ABNORMAL HIGH (ref 3.5–5.1)
SODIUM: 137 mmol/L (ref 135–145)
TOTAL PROTEIN: 6.5 g/dL (ref 6.5–8.1)

## 2017-11-13 LAB — BASIC METABOLIC PANEL
Anion gap: 8 (ref 5–15)
BUN: <5 mg/dL — ABNORMAL LOW (ref 6–20)
CALCIUM: 8.9 mg/dL (ref 8.9–10.3)
CO2: 25 mmol/L (ref 22–32)
CREATININE: 0.53 mg/dL (ref 0.44–1.00)
Chloride: 105 mmol/L (ref 98–111)
GFR calc non Af Amer: >60 mL/min (ref >60)
Glucose, Bld: 109 mg/dL — ABNORMAL HIGH (ref 70–99)
Potassium: 4.2 mmol/L (ref 3.5–5.1)
Sodium: 138 mmol/L (ref 135–145)

## 2017-11-13 MED ORDER — OXYCODONE HCL 5 MG PO TABS
5.0000 mg | ORAL_TABLET | ORAL | Status: DC | PRN
Start: 2017-11-13 — End: 2017-11-14
  Administered 2017-11-13: 5 mg via ORAL
  Administered 2017-11-13 (×2): 10 mg via ORAL
  Administered 2017-11-14: 5 mg via ORAL
  Filled 2017-11-13: qty 2
  Filled 2017-11-13: qty 1
  Filled 2017-11-13 (×2): qty 2

## 2017-11-13 MED ORDER — OXYCODONE HCL 5 MG PO TABS: 5.0000 mg | ORAL_TABLET | Freq: Four times a day (QID) | ORAL | 0 refills | Status: DC | PRN

## 2017-11-13 MED ORDER — HYDROMORPHONE HCL 1 MG/ML IJ SOLN: 0.5000 mg | INTRAMUSCULAR | Status: DC | PRN

## 2017-11-13 MED ORDER — METHOCARBAMOL 500 MG PO TABS
500.0000 mg | ORAL_TABLET | Freq: Three times a day (TID) | ORAL | Status: DC | PRN
  Administered 2017-11-13: 500 mg via ORAL
  Filled 2017-11-13 (×2): qty 1

## 2017-11-13 MED ORDER — ACETAMINOPHEN 325 MG PO TABS: 650.0000 mg | ORAL_TABLET | Freq: Four times a day (QID) | ORAL | Status: DC | PRN

## 2017-11-13 NOTE — Progress Notes (Addendum)
[facility] Progress Note  1 Day Post-Op  Subjective: CC-  Sitting up in chair. Only has fentanyl ordered for pain medication, states that his helps but wears off quickly. Tolerating clears. Denies n/v.   Bilirubin down to 2.3 today from 2.5. AST/ALT slightly up 56/90.  Objective: Vital signs in last 24 hours: Temp:  [97.7 F (36.5 C)-98.4 F (36.9 C)] 98.1 F (36.7 C) (08/17 0440) Pulse Rate:  [79-100] 82 (08/17 0440) Resp:  [20-22] 20 (08/17 0440) BP: (80-150)/(53-93) 135/82 (08/17 0440) SpO2:  [92 %-100 %] 100 % (08/17 0440) Last BM Date: 11/11/17  Intake/Output from previous day: 08/16 0701 - 08/17 0700 In: 2938.5 [P.O.:240; I.V.:2698.5] Out: 25 [Blood:25] Intake/Output this shift: Total I/O In: -  Out: 800 [Urine:800]  PE: Gen:  Alert, NAD Pulm:  effort normal Abd: Soft, NT/ND, +BS, lap incisions C/D/I Psych: A&Ox3  Skin: no rashes noted, warm and dry  Lab Results:  Recent Labs    11/11/17 0801  WBC 8.8  HGB 12.2  HCT 38.8  PLT 406*   BMET Recent Labs    11/13/17 0428 11/13/17 0709  NA 137 138  K 6.0* 4.2  CL 104 105  CO2 24 25  GLUCOSE 97 109*  BUN <5* <5*  CREATININE 0.62 0.53  CALCIUM 8.9 8.9   PT/INR No results for input(s): LABPROT, INR in the last 72 hours. CMP     Component Value Date/Time   NA 138 11/13/2017 0709   K 4.2 11/13/2017 0709   CL 105 11/13/2017 0709   CO2 25 11/13/2017 0709   GLUCOSE 109 (H) 11/13/2017 0709   BUN <5 (L) 11/13/2017 0709   CREATININE 0.53 11/13/2017 0709   CALCIUM 8.9 11/13/2017 0709   PROT 6.5 11/13/2017 0428   ALBUMIN 2.6 (L) 11/13/2017 0428   AST 56 (H) 11/13/2017 0428   ALT 90 (H) 11/13/2017 0428   ALKPHOS 156 (H) 11/13/2017 0428   BILITOT 2.3 (H) 11/13/2017 0428   GFRNONAA >60 11/13/2017 0709   GFRAA >60 11/13/2017 0709   Lipase     Component Value Date/Time   LIPASE 111 (H) 11/12/2017 0404       Studies/Results: Dg Cholangiogram Operative  Result Date:  11/12/2017 CLINICAL DATA:  Laparoscopic cholecystectomy.  Cholelithiasis. EXAM: INTRAOPERATIVE CHOLANGIOGRAM TECHNIQUE: Cholangiographic images from the C-arm fluoroscopic device were submitted for interpretation post-operatively. Please see the procedural report for the amount of contrast and the fluoroscopy time utilized. COMPARISON:  Ultrasound 11/11/2017 FINDINGS: Contrast does not fill the biliary system. There is contrast pooling near the cholecystectomy clips and flowing into the intra-abdominal cavity. IMPRESSION: Biliary system was not opacified with contrast. There is contrast within the abdominal cavity. Electronically Signed   By: Richarda OverlieAdam  Henn M.D.   On: 11/12/2017 09:54   [place] Abdomen Limited  Result Date: 11/11/2017 CLINICAL DATA:  Upper abdominal pain EXAM: ULTRASOUND ABDOMEN LIMITED RIGHT UPPER QUADRANT COMPARISON:  None. FINDINGS: Gallbladder: Within the gallbladder, there are echogenic foci which move and shadow consistent with cholelithiasis. Largest gallstone measures 1.3 cm in length. There is no appreciable gallbladder wall thickening or pericholecystic fluid. No sonographic Murphy sign noted by sonographer. Common bile duct: Diameter: 4 mm. No intrahepatic or extrahepatic biliary duct dilatation. Liver: No focal lesion identified. Within normal limits in parenchymal echogenicity. Portal vein is patent on color Doppler imaging with normal direction of blood flow towards the liver. IMPRESSION: Cholelithiasis. No gallbladder wall thickening or pericholecystic fluid. Study otherwise unremarkable. Electronically Signed   By: Chrissie NoaWilliam  Margarita GrizzleWoodruff III M.D.   On: 11/11/2017 12:06    Anti-infectives: Anti-infectives (From admission, onward)   Start     Dose/Rate Route Frequency Ordered Stop   11/12/17 0800  ceFAZolin (ANCEF) IVPB 2g/100 mL premix     2 g 200 mL/hr over 30 Minutes Intravenous To Short Stay 11/12/17 0748 11/12/17 0921   11/11/17 1400  cephALEXin (KEFLEX) capsule 500 mg     500 mg  Oral  Once 11/11/17 1353 11/11/17 1358   11/11/17 1400  metroNIDAZOLE (FLAGYL) tablet 500 mg     500 mg Oral  Once 11/11/17 1353 11/11/17 1358       Assessment/Plan Gallstone pancreatitis, biliary colic S/p Laparoscopic cholecystectomy with cholangiogram - POD 1 - Bilirubin down to 2.3 today from 2.5. AST/ALT slightly up 56/90  ID - abx perioperative FEN - reg diet VTE - SCDs, lovenox Foley - none Follow up - [facility] clinic  Plan: Advance to regular diet and add oral pain medications.  Reviewed labs with MD; bilirubin trending down, ok to discharge from surgical standpoint if pain controlled on oral medications. F/u 1 week in [facility] clinic with CMP. Patient knows to call/return to ED if she has increased pain, nausea, vomiting, fever... Rx for pain medication sent to pharmacy.   LOS: 2 days    Franne FortsBrooke A Lucion Dilger , [facility] 11/13/2017, 9:23 AM Pager: [phone] Consults: [phone] Mon 7:00 am -11:30 AM Tues-Fri 7:00 am-4:30 pm Sat-Sun 7:00 am-11:30 am

## 2017-11-13 NOTE — Progress Notes (Signed)
   Subjective: Ms. Kristen Valentine continues to have pain at the umbilical laparoscopy site. Her epigastric abdominal pain and nausea remains resolved. She tolerated a full liquid diet and feels ready to try regular diet regular today. She has been ambulating around her room to get to the restroom, pain has not limited this. No BM since the time of admission. She is updated on the plan and questions about pain med regiment were answered.     Objective:  Vital signs in last 24 hours: Vitals:   11/12/17 1052 11/12/17 1810 11/12/17 2139 11/13/17 0440  BP: (!) 141/83 (!) 150/86 (!) 147/93 135/82  Pulse: 98 83 79 82  Resp: 20 20  20   Temp: 98.4 F (36.9 C) 98.2 F (36.8 C) 97.7 F (36.5 C) 98.1 F (36.7 C)  TempSrc: Oral Oral Oral Oral  SpO2: 93% 98% 96% 100%  Weight:      Height:       General: well appearing, no acute distress, demonstrating incentive spirometry for me  Cardiac: 1/6 systolic murmur, no peripheral edema or calf tenderness, normal rate and regular rhythm Pulm: normal work of breathing, lungs clear to ausculation, not on supplemental oxygen  Abdomen: bowel sounds delayed, abdomen is soft, non distended, no guarding, periumbilical tenderness  Assessment/Plan:  Active Problems:   Pancreatitis  Pancreatitis 2/2 gallstones  S/p Cholecystectomy  - POD #1 after uncomplicated cholecystectomy, of note IOC was not conclusive as dye could not be advanced beyond the cystic duct but the gallbladder was successfully removed. Resolution of epigastric pain and nausea with tolerance of oral foods and gradual improvement in transaminase is reassuring.  - advance diet to regular with close monitoring for resolution of symptoms  - meds transitioned to PO oxycodone with IV dilaudid as needed for breakthrough  - continue regular IS   Abnormal lab  - hyperK on AM labs which were hemolyzed, recheck K was WNL   Dispo: Anticipated discharge today or tomorrow depending on tolerance of regular diet.     Eulah PontBlum, Donette Mainwaring, MD 11/13/2017, 10:28 AM Pager: [phone]

## 2017-11-13 NOTE — Progress Notes (Signed)
Pt was up to eat her breakfast, and then went out and walked in the hall.  I have been trying to encourage her to get up oob and walk in the hall and sit in chair.  She agreed to eat her dinner in the chair this day.

## 2017-11-13 NOTE — Progress Notes (Signed)
Patient ID: Kristen Valentine, female   DOB: 08/11/1990, 27 y.o.   MRN: [id number] See progress note OK to D/C  We will note F/U in navigator  Violeta GelinasBurke Trannie Bardales, MD, MPH, FACS Trauma: [phone] General Surgery: [phone]

## 2017-11-14 LAB — HEPATIC FUNCTION PANEL
ALK PHOS: 132 U/L — AB (ref 38–126)
ALT: 69 U/L — AB (ref 0–44)
AST: 31 U/L (ref 15–41)
Albumin: 2.8 g/dL — ABNORMAL LOW (ref 3.5–5.0)
Bilirubin, Direct: 0.3 mg/dL — ABNORMAL HIGH (ref 0.0–0.2)
Indirect Bilirubin: 0.4 mg/dL (ref 0.3–0.9)
TOTAL PROTEIN: 7.3 g/dL (ref 6.5–8.1)
Total Bilirubin: 0.7 mg/dL (ref 0.3–1.2)

## 2017-11-14 MED ORDER — DOCUSATE SODIUM 100 MG PO CAPS
100.0000 mg | ORAL_CAPSULE | Freq: Two times a day (BID) | ORAL | Status: DC
  Administered 2017-11-14: 100 mg via ORAL
  Filled 2017-11-14: qty 1

## 2017-11-14 MED ORDER — DOCUSATE SODIUM 100 MG PO CAPS: 100.0000 mg | ORAL_CAPSULE | Freq: Two times a day (BID) | ORAL | 0 refills | Status: AC

## 2017-11-14 NOTE — Progress Notes (Addendum)
   Subjective: She is feeling well today. Tolerated regular diet overnight without reoccurrence of the epigastric abdominal pain or nausea. She has ambulated in the hallway. No bowel movement yet. She feels prepared to leave and was updated on the plan.   Objective:  Vital signs in last 24 hours: Vitals:   11/13/17 1410 11/13/17 2126 11/14/17 0524 11/14/17 0900  BP: (!) 141/90 (!) 115/55 127/70   Pulse: 83 78 62   Resp: 18  18   Temp: 98.5 F (36.9 C) 97.9 F (36.6 C) 98.3 F (36.8 C)   TempSrc: Oral Oral Oral   SpO2: 100% 98% 98%   Weight:    122.9 kg  Height:       General: well appearing, no acute distress  Cardiac: 1/6 systolic murmur loudest over RUSB, regular rate and rhythm, no peripheral edema  Pulm: normal work of breathing  GI: abdomen is soft, no epigastric tenderness, laparoscopy sites are clean dry and intact   Assessment/Plan:  Active Problems:   Pancreatitis  Pancreatitis 2/2 gallstones  S/p Cholecystectomy  - POD #2 after uncomplicated cholecystectomy.  Still with resolution of epigastric pain and nausea with tolerance of oral foods and gradual improvement in transaminase is reassuring.  - will provide a course of PO oxycodone at discharge  - continue regular IS  - plan is for follow up with general surgery in one week   Dispo: Anticipated discharge today  Kristen Valentine, Carthel Castille, MD 11/14/2017, 10:21 AM Pager: [phone]

## 2017-11-15 ENCOUNTER — Encounter (HOSPITAL_COMMUNITY): Payer: Self-pay | Admitting: General Surgery

## 2017-11-16 ENCOUNTER — Encounter: Payer: Self-pay | Admitting: Internal Medicine

## 2017-11-16 ENCOUNTER — Telehealth: Payer: Self-pay

## 2017-11-16 NOTE — Telephone Encounter (Signed)
I believe Dr. Obie DredgeBlum provided a letter prior to the patient's discharge.

## 2017-11-16 NOTE — Telephone Encounter (Signed)
Thank you for helping with this, it appears that the letter dated 8/18 is still available in her chart.

## 2017-11-16 NOTE — Telephone Encounter (Addendum)
Wouldn't dr wakefield need to release pt and determine when she could go back to work? She states she needs a letter. Please advise. Sending to dr's blum and vincent

## 2017-11-16 NOTE — Telephone Encounter (Signed)
Called pt back, she states she did not get the letter, she will come by and pick up the letter

## 2017-11-16 NOTE — Telephone Encounter (Signed)
Requesting a note to go back to work. Please call pt back.  

## 2017-11-22 NOTE — Anesthesia Postprocedure Evaluation (Signed)
Anesthesia Post Note  Patient: Kristen Valentine  Procedure(s) Performed: LAPAROSCOPIC CHOLECYSTECTOMY WITH INTRAOPERATIVE CHOLANGIOGRAM (N/A Abdomen)     Patient location during evaluation: PACU Anesthesia Type: General Level of consciousness: awake and alert Pain management: pain level controlled Vital Signs Assessment: post-procedure vital signs reviewed and stable Respiratory status: spontaneous breathing, nonlabored ventilation, respiratory function stable and patient connected to nasal cannula oxygen Cardiovascular status: blood pressure returned to baseline and stable Postop Assessment: no apparent nausea or vomiting Anesthetic complications: no    Last Vitals:  Vitals:   11/13/17 2126 11/14/17 0524  BP: (!) 115/55 127/70  Pulse: 78 62  Resp:  18  Temp: 36.6 C 36.8 C  SpO2: 98% 98%    Last Pain:  Vitals:   11/14/17 0916  TempSrc:   PainSc: 4                  [name], Vaishnavi Dalby E

## 2019-01-16 ENCOUNTER — Encounter (HOSPITAL_COMMUNITY): Payer: Self-pay

## 2019-01-16 ENCOUNTER — Other Ambulatory Visit: Payer: Self-pay

## 2019-01-16 ENCOUNTER — Ambulatory Visit (HOSPITAL_COMMUNITY)
Admission: EM | Admit: 2019-01-16 | Discharge: 2019-01-16 | Disposition: A | Discharge status: Home / Self Care | Payer: Medicaid Other | Attending: Emergency Medicine | Admitting: Emergency Medicine

## 2019-01-16 DIAGNOSIS — J069 Acute upper respiratory infection, unspecified: Secondary | ICD-10-CM | POA: Insufficient documentation

## 2019-01-16 DIAGNOSIS — Z20828 Contact with and (suspected) exposure to other viral communicable diseases: Secondary | ICD-10-CM | POA: Insufficient documentation

## 2019-01-16 DIAGNOSIS — Z79899 Other long term (current) drug therapy: Secondary | ICD-10-CM | POA: Insufficient documentation

## 2019-01-16 MED ORDER — GUAIFENESIN ER 600 MG PO TB12: 1200.0000 mg | ORAL_TABLET | Freq: Two times a day (BID) | ORAL | 0 refills | Status: DC | PRN

## 2019-01-16 MED ORDER — FLUTICASONE PROPIONATE 50 MCG/ACT NA SUSP: 1.0000 | Freq: Every day | NASAL | 0 refills | Status: AC

## 2019-01-16 NOTE — ED Triage Notes (Signed)
Patient presents to Urgent Care with complaints of runny nose since 2 days ago.

## 2019-01-16 NOTE — Discharge Instructions (Addendum)
Take the Flonase and Mucinex as directed.    Your COVID test is pending.  You should self quarantine until your test result is back and is negative.    Go to the emergency department if you develop high fever, shortness of breath, severe diarrhea, or other concerning symptoms.

## 2019-01-16 NOTE — ED Provider Notes (Signed)
[facility]    CSN: [id number] Arrival date & time: 01/16/19  0867      History   Chief Complaint Chief Complaint  Patient presents with  . Nasal Congestion    HPI Kristen Valentine is a 28 y.o. female.   Patient presents with nonproductive cough, nasal congestion, runny nose x 2 days.  She denies fever, chills, sore throat, shortness of breath, vomiting, diarrhea, or other symptoms.  She is employed in a group home.  The history is provided by the patient.    Past Medical History:  Diagnosis Date  . Chlamydia infection affecting pregnancy 01/07/2016  . Pancreatitis 11/11/2017    Patient Active Problem List   Diagnosis Date Noted  . Pancreatitis 11/11/2017  . Chlamydia infection affecting pregnancy 01/07/2016  . Chlamydia infection 01/07/2016  . Vaginal delivery 01/05/2016    Past Surgical History:  Procedure Laterality Date  . CHOLECYSTECTOMY N/A 11/12/2017   Procedure: LAPAROSCOPIC CHOLECYSTECTOMY WITH INTRAOPERATIVE CHOLANGIOGRAM;  Surgeon: Emelia Loron, MD;  Location: [facility];  Service: General;  Laterality: N/A;    OB History    Gravida  1   Para  1   Term  0   Preterm  0   AB  0   Living  1     SAB  0   TAB  0   Ectopic  0   Multiple  0   Live Births  1            Home Medications    Prior to Admission medications   Medication Sig Start Date End Date Taking? Authorizing Provider  docusate sodium (COLACE) 100 MG capsule Take 1 capsule (100 mg total) by mouth 2 (two) times daily. 11/14/17   Eulah Pont, MD  fluticasone [facility]) 50 MCG/ACT nasal spray Place 1 spray into both nostrils daily. 01/16/19   Mickie Bail, NP  guaiFENesin (MUCINEX) 600 MG 12 hr tablet Take 2 tablets (1,200 mg total) by mouth 2 (two) times daily as needed. 01/16/19   Mickie Bail, NP  oxyCODONE (OXY IR/ROXICODONE) 5 MG immediate release tablet Take 1-2 tablets (5-10 mg total) by mouth every 6 (six) hours as needed for severe pain. 11/13/17   Meuth,  Lina Sar, PA-C    Family History Family History  Problem Relation Age of Onset  . Healthy Mother   . Healthy Father     Social History Social History   Tobacco Use  . Smoking status: Never Smoker  . Smokeless tobacco: Never Used  Substance Use Topics  . Alcohol use: No  . Drug use: No     Allergies   Patient has no known allergies.   Review of Systems Review of Systems  Constitutional: Negative for chills and fever.  HENT: Positive for congestion and rhinorrhea. Negative for ear pain and sore throat.   Eyes: Negative for pain and visual disturbance.  Respiratory: Positive for cough. Negative for shortness of breath.   Cardiovascular: Negative for chest pain and palpitations.  Gastrointestinal: Negative for abdominal pain and vomiting.  Genitourinary: Negative for dysuria and hematuria.  Musculoskeletal: Negative for arthralgias and back pain.  Skin: Negative for color change and rash.  Neurological: Negative for seizures and syncope.  All other systems reviewed and are negative.    Physical Exam Triage Vital Signs ED Triage Vitals  Enc Vitals Group     BP 01/16/19 0946 134/85     Pulse Rate 01/16/19 0946 68     Resp 01/16/19 0946  18     Temp 01/16/19 0946 97.8 F (36.6 C)     Temp Source 01/16/19 0946 Oral     SpO2 01/16/19 0946 100 %     Weight --      Height --      Head Circumference --      Peak Flow --      Pain Score 01/16/19 0944 2     Pain Loc --      Pain Edu? --      Excl. in [place]? --    No data found.  Updated Vital Signs BP 134/85 (BP Location: Right Arm)   Pulse 68   Temp 97.8 F (36.6 C) (Oral)   Resp 18   SpO2 100%   Visual Acuity Right Eye Distance:   Left Eye Distance:   Bilateral Distance:    Right Eye Near:   Left Eye Near:    Bilateral Near:     Physical Exam Vitals signs and nursing note reviewed.  Constitutional:      General: She is not in acute distress.    Appearance: She is well-developed.  HENT:     Head:  Normocephalic and atraumatic.     Right Ear: Tympanic membrane normal.     Left Ear: Tympanic membrane normal.     Nose: Congestion and rhinorrhea present.     Mouth/Throat:     Mouth: Mucous membranes are moist.     Pharynx: Oropharynx is clear.  Eyes:     Conjunctiva/sclera: Conjunctivae normal.  Neck:     Musculoskeletal: Neck supple.  Cardiovascular:     Rate and Rhythm: Normal rate and regular rhythm.     Heart sounds: No murmur.  Pulmonary:     Effort: Pulmonary effort is normal. No respiratory distress.     Breath sounds: Normal breath sounds. No wheezing or rhonchi.  Abdominal:     General: Bowel sounds are normal.     Palpations: Abdomen is soft.     Tenderness: There is no abdominal tenderness. There is no guarding or rebound.  Skin:    General: Skin is warm and dry.     Findings: No rash.  Neurological:     Mental Status: She is alert.      UC Treatments / Results  Labs (all labs ordered are listed, but only abnormal results are displayed) Labs Reviewed  NOVEL CORONAVIRUS, NAA (HOSP ORDER, SEND-OUT TO REF LAB; TAT 18-24 HRS)    EKG   Radiology No results found.  Procedures Procedures (including critical care time)  Medications Ordered in UC Medications - No data to display  Initial Impression / Assessment and Plan / UC Course  I have reviewed the triage vital signs and the nursing notes.  Pertinent labs & imaging results that were available during my care of the patient were reviewed by me and considered in my medical decision making (see chart for details).    Viral upper respiratory infection.  Treating with Flonase and Mucinex.  COVID test performed here.  Instructed patient to self quarantine until her test results are back.  Instructed patient to go to the emergency department if she develops high fever, shortness of breath, severe diarrhea, or other concerning symptoms.  Patient agrees with plan of care.   Final Clinical Impressions(s) / UC  Diagnoses   Final diagnoses:  Viral upper respiratory tract infection     Discharge Instructions     Take the Flonase and Mucinex as directed.  Your COVID test is pending.  You should self quarantine until your test result is back and is negative.    Go to the emergency department if you develop high fever, shortness of breath, severe diarrhea, or other concerning symptoms.       ED Prescriptions    Medication Sig Dispense Auth. Provider   fluticasone (FLONASE) 50 MCG/ACT nasal spray Place 1 spray into both nostrils daily. 16 g Sharion Balloon, NP   guaiFENesin (MUCINEX) 600 MG 12 hr tablet Take 2 tablets (1,200 mg total) by mouth 2 (two) times daily as needed. 12 tablet Sharion Balloon, NP     PDMP not reviewed this encounter.   Sharion Balloon, NP 01/16/19 1013

## 2019-01-18 LAB — NOVEL CORONAVIRUS, NAA (HOSP ORDER, SEND-OUT TO REF LAB; TAT 18-24 HRS): SARS-CoV-2, NAA: NOT DETECTED

## 2019-05-06 ENCOUNTER — Emergency Department (HOSPITAL_COMMUNITY)
Admission: EM | Admit: 2019-05-06 | Discharge: 2019-05-06 | Disposition: A | Discharge status: Home / Self Care | Payer: PRIVATE HEALTH INSURANCE | Attending: Emergency Medicine | Admitting: Emergency Medicine

## 2019-05-06 ENCOUNTER — Other Ambulatory Visit: Payer: Self-pay

## 2019-05-06 ENCOUNTER — Encounter (HOSPITAL_COMMUNITY): Payer: Self-pay | Admitting: *Deleted

## 2019-05-06 DIAGNOSIS — Y9389 Activity, other specified: Secondary | ICD-10-CM | POA: Insufficient documentation

## 2019-05-06 DIAGNOSIS — S0502XA Injury of conjunctiva and corneal abrasion without foreign body, left eye, initial encounter: Secondary | ICD-10-CM | POA: Insufficient documentation

## 2019-05-06 DIAGNOSIS — W504XXA Accidental scratch by another person, initial encounter: Secondary | ICD-10-CM | POA: Insufficient documentation

## 2019-05-06 DIAGNOSIS — Y9289 Other specified places as the place of occurrence of the external cause: Secondary | ICD-10-CM | POA: Diagnosis not present

## 2019-05-06 DIAGNOSIS — R030 Elevated blood-pressure reading, without diagnosis of hypertension: Secondary | ICD-10-CM | POA: Diagnosis not present

## 2019-05-06 DIAGNOSIS — S0592XA Unspecified injury of left eye and orbit, initial encounter: Secondary | ICD-10-CM | POA: Diagnosis present

## 2019-05-06 DIAGNOSIS — Z79899 Other long term (current) drug therapy: Secondary | ICD-10-CM | POA: Diagnosis not present

## 2019-05-06 DIAGNOSIS — Y990 Civilian activity done for income or pay: Secondary | ICD-10-CM | POA: Insufficient documentation

## 2019-05-06 MED ORDER — TETRACAINE HCL 0.5 % OP SOLN
2.0000 [drp] | Freq: Once | OPHTHALMIC | Status: AC
  Administered 2019-05-06: 11:00:00 2 [drp] via OPHTHALMIC
  Filled 2019-05-06: qty 4

## 2019-05-06 MED ORDER — FLUORESCEIN SODIUM 1 MG OP STRP
1.0000 | ORAL_STRIP | Freq: Once | OPHTHALMIC | Status: AC
  Administered 2019-05-06: 1 via OPHTHALMIC
  Filled 2019-05-06: qty 1

## 2019-05-06 MED ORDER — ERYTHROMYCIN 5 MG/GM OP OINT: TOPICAL_OINTMENT | OPHTHALMIC | 0 refills | Status: DC

## 2019-05-06 NOTE — Discharge Instructions (Signed)
Contact a health care provider if: You continue to have eye pain and other symptoms for more than 2 days. You have new symptoms, such as worse redness, tearing, or discharge. You have discharge that makes your eyelids stick together in the morning. Your eye patch becomes so loose that you can blink your eye. Symptoms return after the original abrasion has healed. Get help right away if: You have severe eye pain that does not get better with medicine. You have vision loss. 
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
## 2019-05-06 NOTE — ED Notes (Signed)
Pt instructed to f/u with primary care doctor about elevated BP. Resources given in AVS to find PCP.

## 2020-04-07 IMAGING — US US ABDOMEN LIMITED
1 series · 14 of 25 positions shown · non-contrast
Comparison: None.

CLINICAL DATA: Upper abdominal pain

EXAM:
ULTRASOUND ABDOMEN LIMITED RIGHT UPPER QUADRANT

[Series 1: us abdomen limited · 0.25mm/px · 14 of 36 slices shown]
[im 1/36]
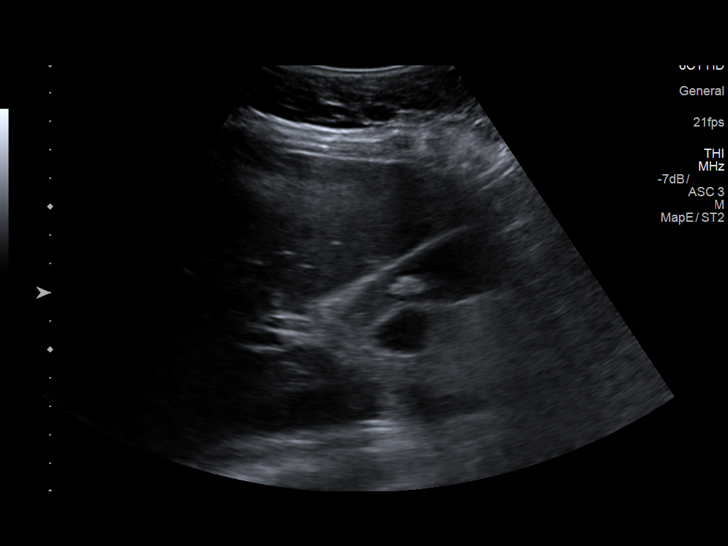
[im 3/36]
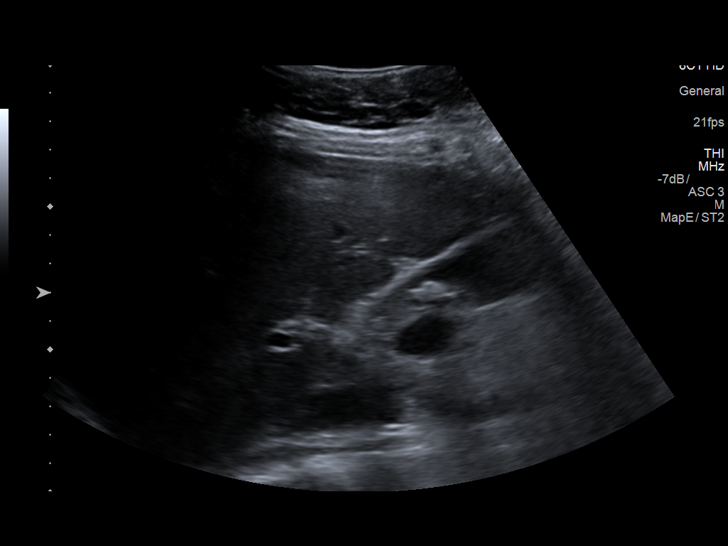
[im 6/36]
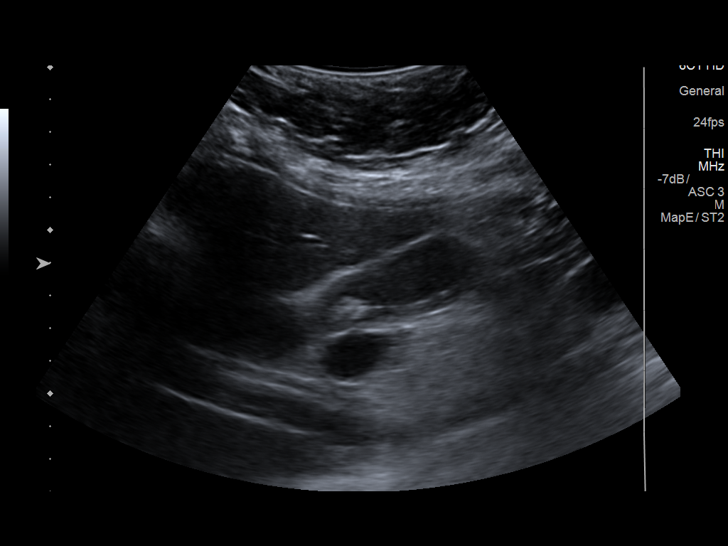
[im 9/36]
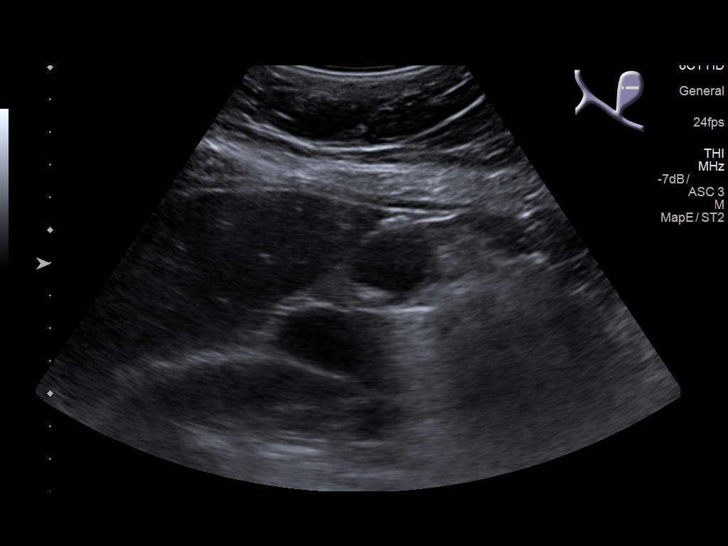
[im 12/36]
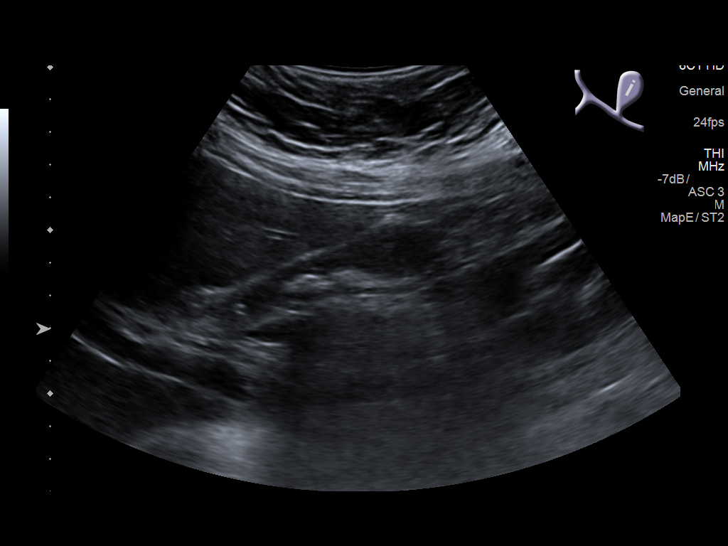
[im 14/36]
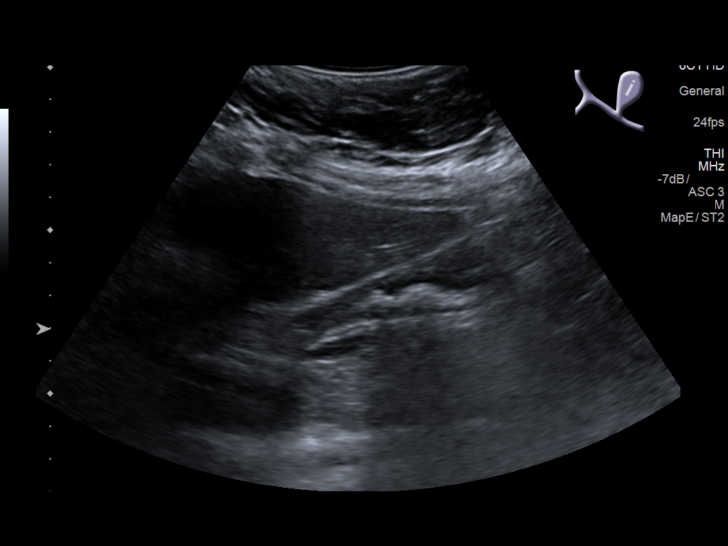
[im 17/36]
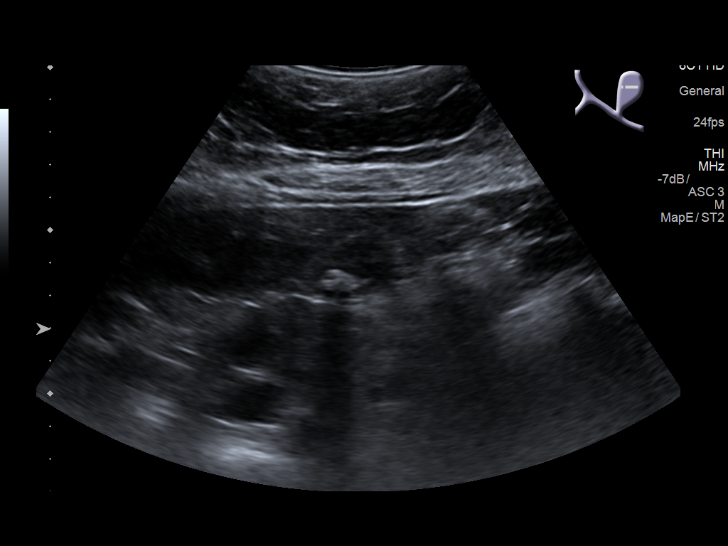
[im 19/36]
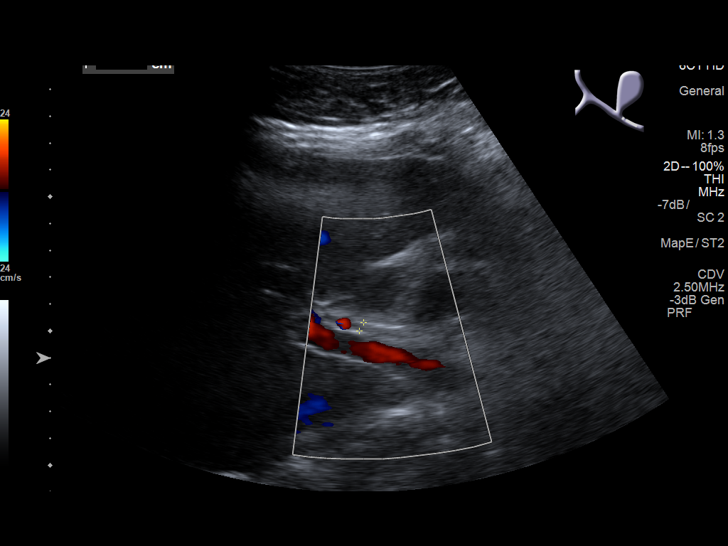
[im 22/36]
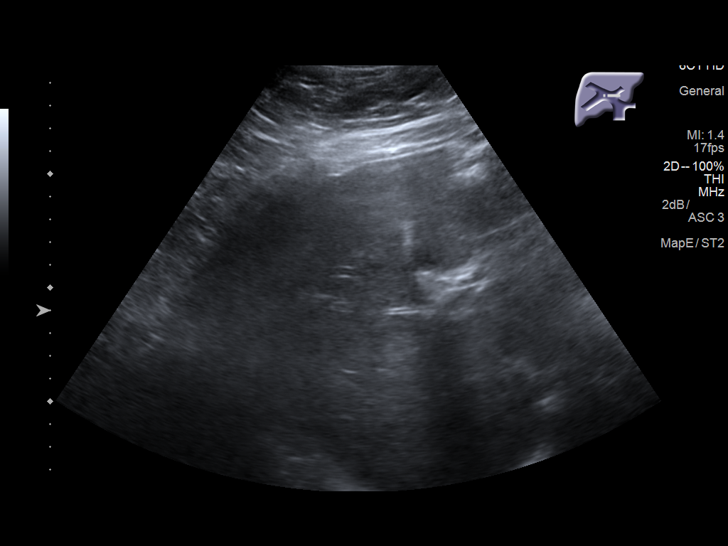
[im 24/36]
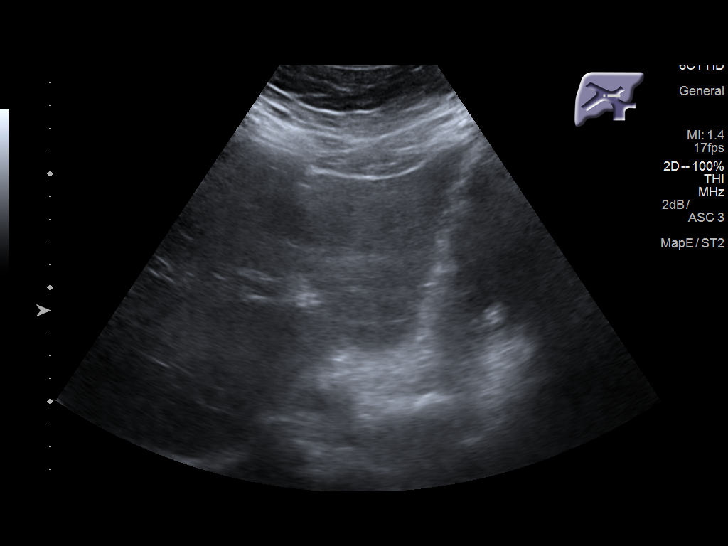
[im 27/36]
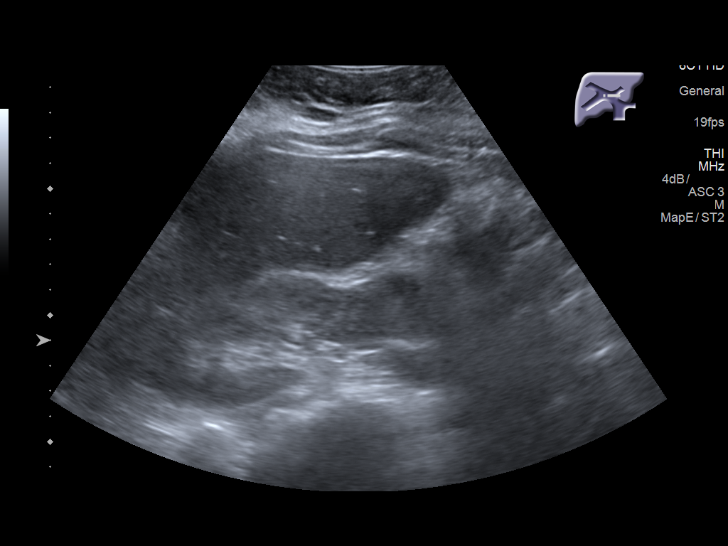
[im 30/36]
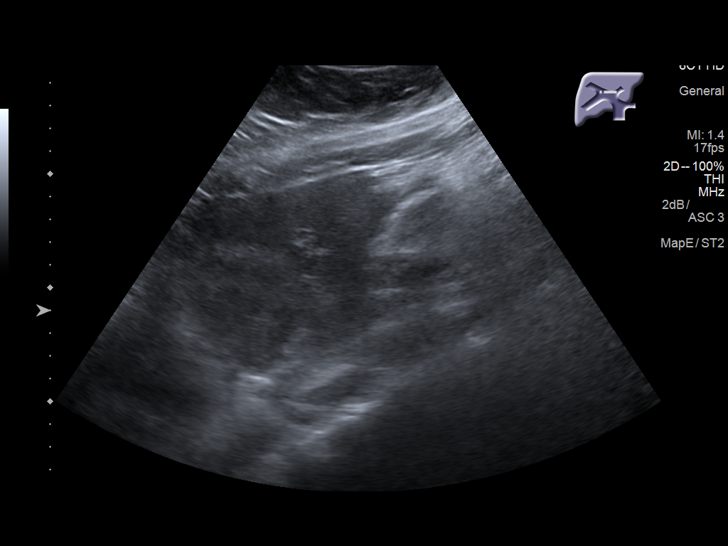
[im 33/36]
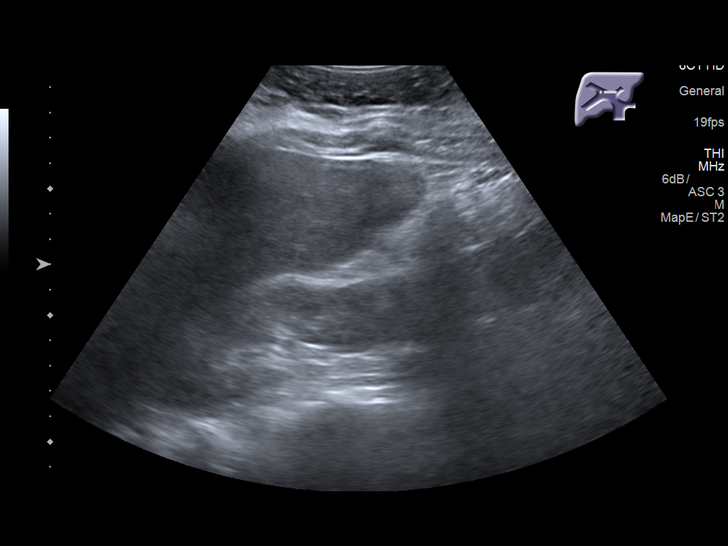
[im 36/36]
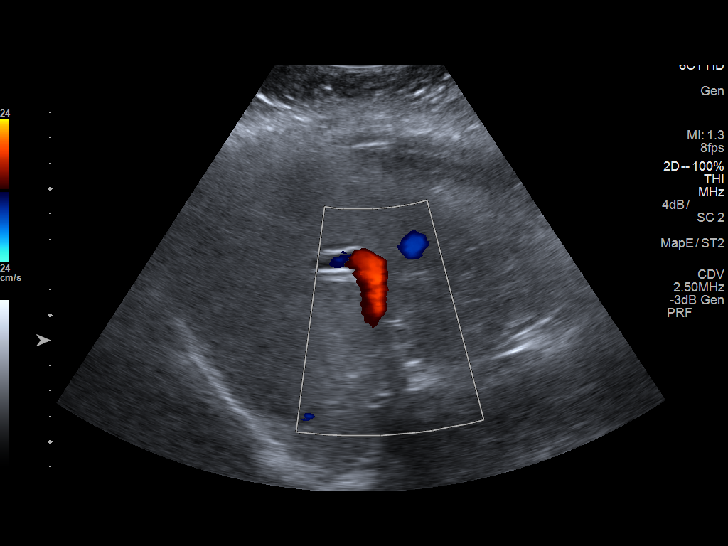

[14 of 25 positions shown; findings below may reference images not displayed]

FINDINGS: Gallbladder:

Within the gallbladder, there are echogenic foci which move and
shadow consistent with cholelithiasis. Largest gallstone measures
1.3 cm in length. There is no appreciable gallbladder wall
thickening or pericholecystic fluid. No sonographic Murphy sign
noted by sonographer.

Common bile duct:

Diameter: 4 mm. No intrahepatic or extrahepatic biliary duct
dilatation.

Liver:

No focal lesion identified. Within normal limits in parenchymal
echogenicity. Portal vein is patent on color Doppler imaging with
normal direction of blood flow towards the liver.
IMPRESSION: Cholelithiasis. No gallbladder wall thickening or pericholecystic
fluid. Study otherwise unremarkable.

## 2020-04-17 IMAGING — RF DG CHOLANGIOGRAM OPERATIVE
1 series · 5 of 5 positions shown · non-contrast
Comparison: Ultrasound 11/11/2017

CLINICAL DATA: Laparoscopic cholecystectomy.  Cholelithiasis.

EXAM:
INTRAOPERATIVE CHOLANGIOGRAM
TECHNIQUE: Cholangiographic images from the C-arm fluoroscopic device were
submitted for interpretation post-operatively. Please see the
procedural report for the amount of contrast and the fluoroscopy
time utilized.

[Series 1: run · 2 acquisitions, 5 frames shown]
[im 1/2]
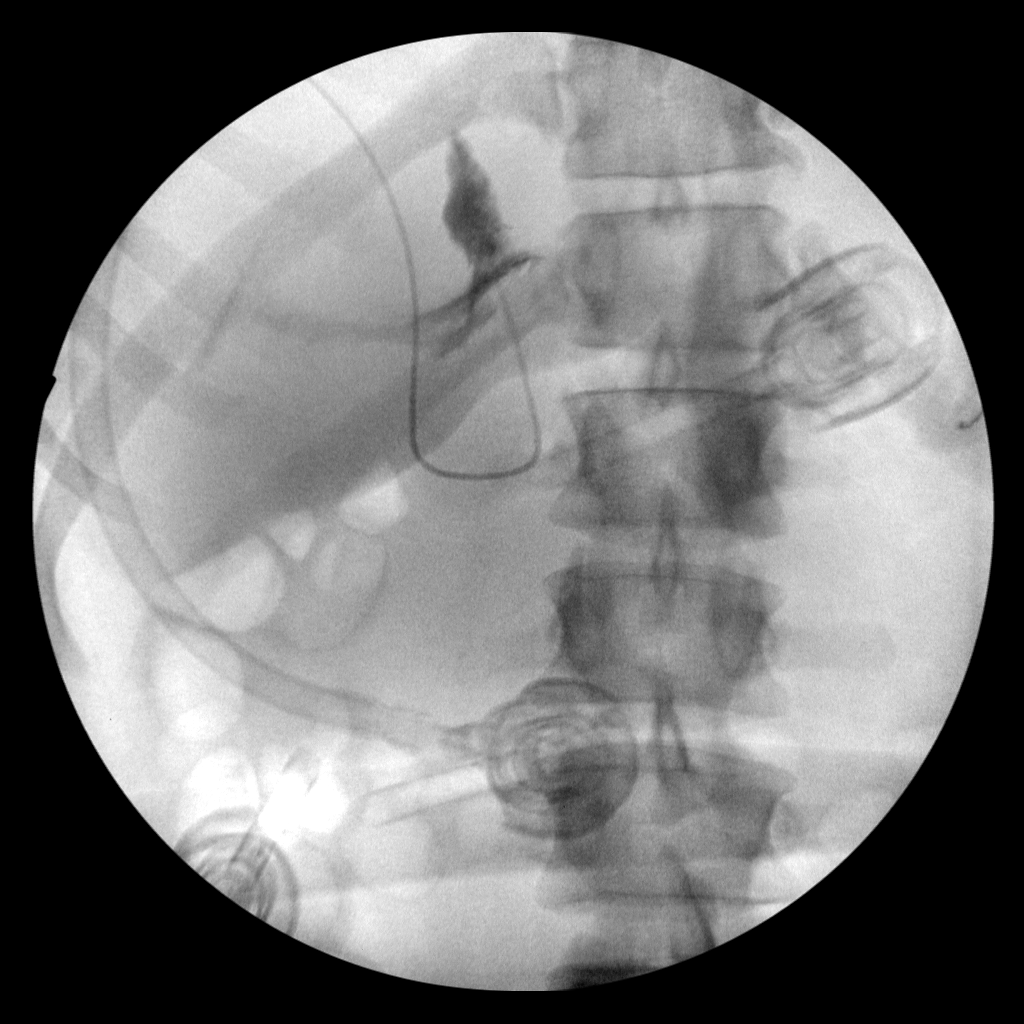
[im 1/2]
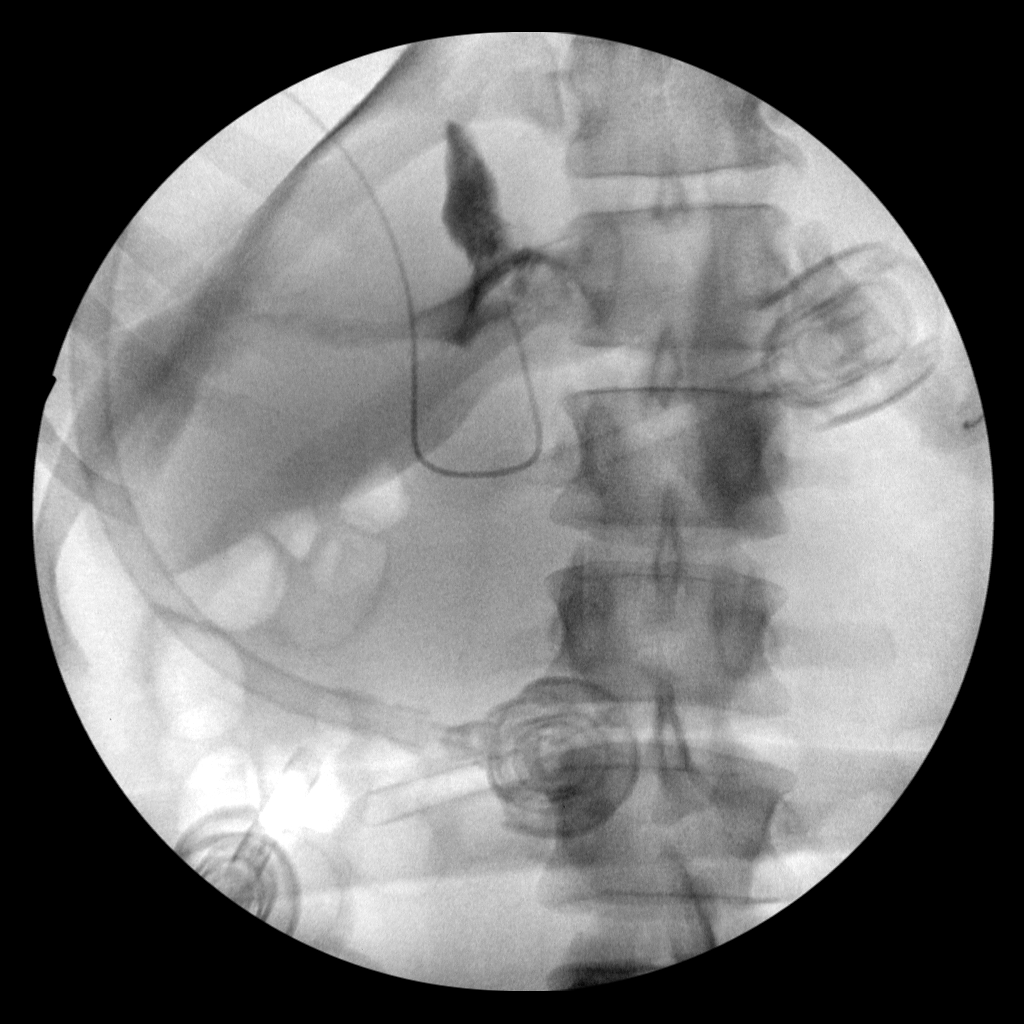
[im 1/2]
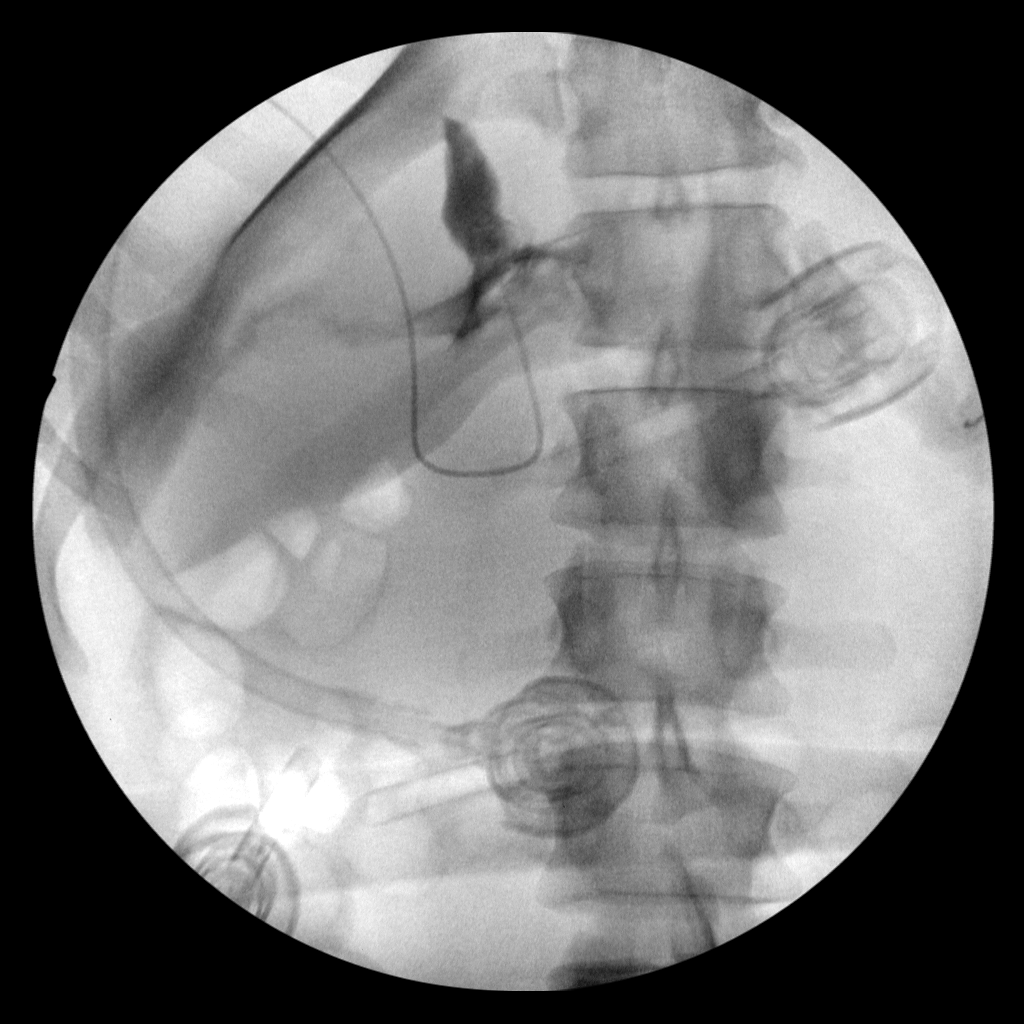
[im 1/2]
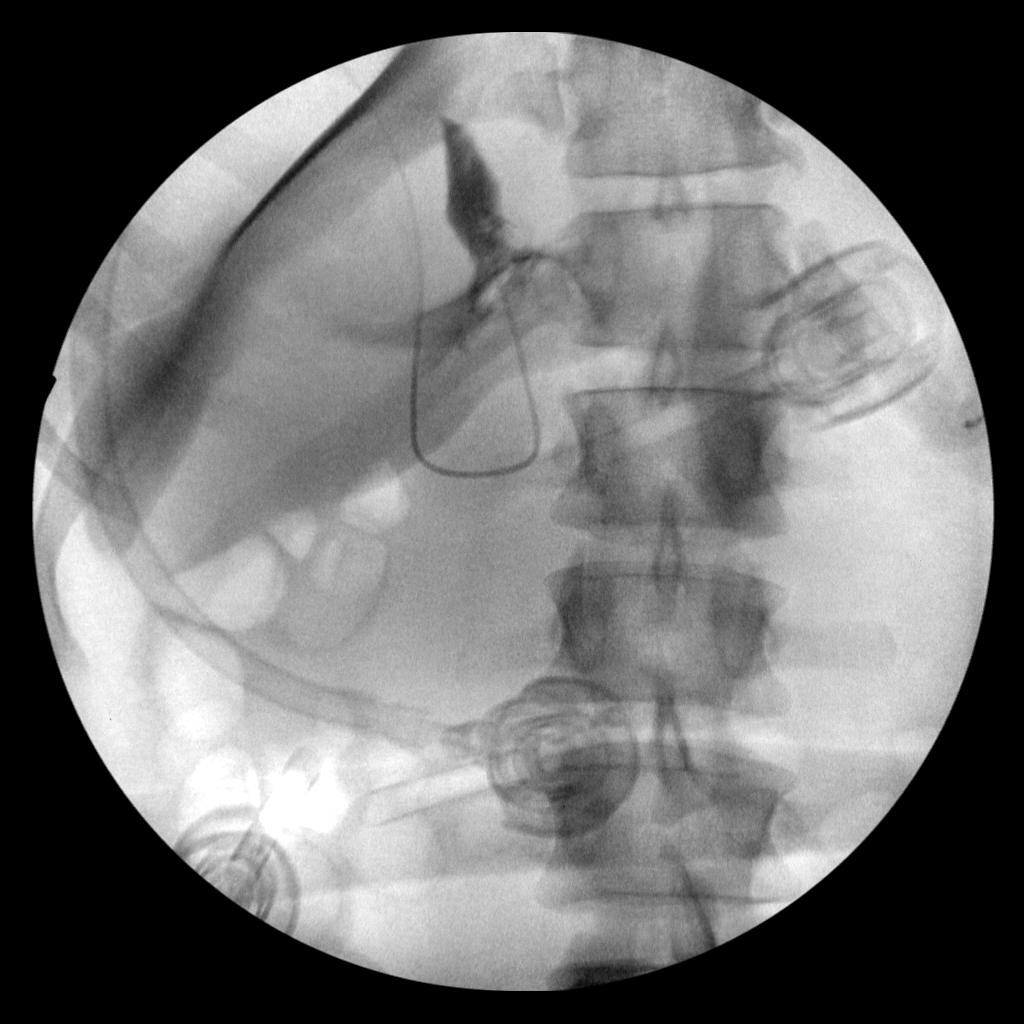
[im 2/2]
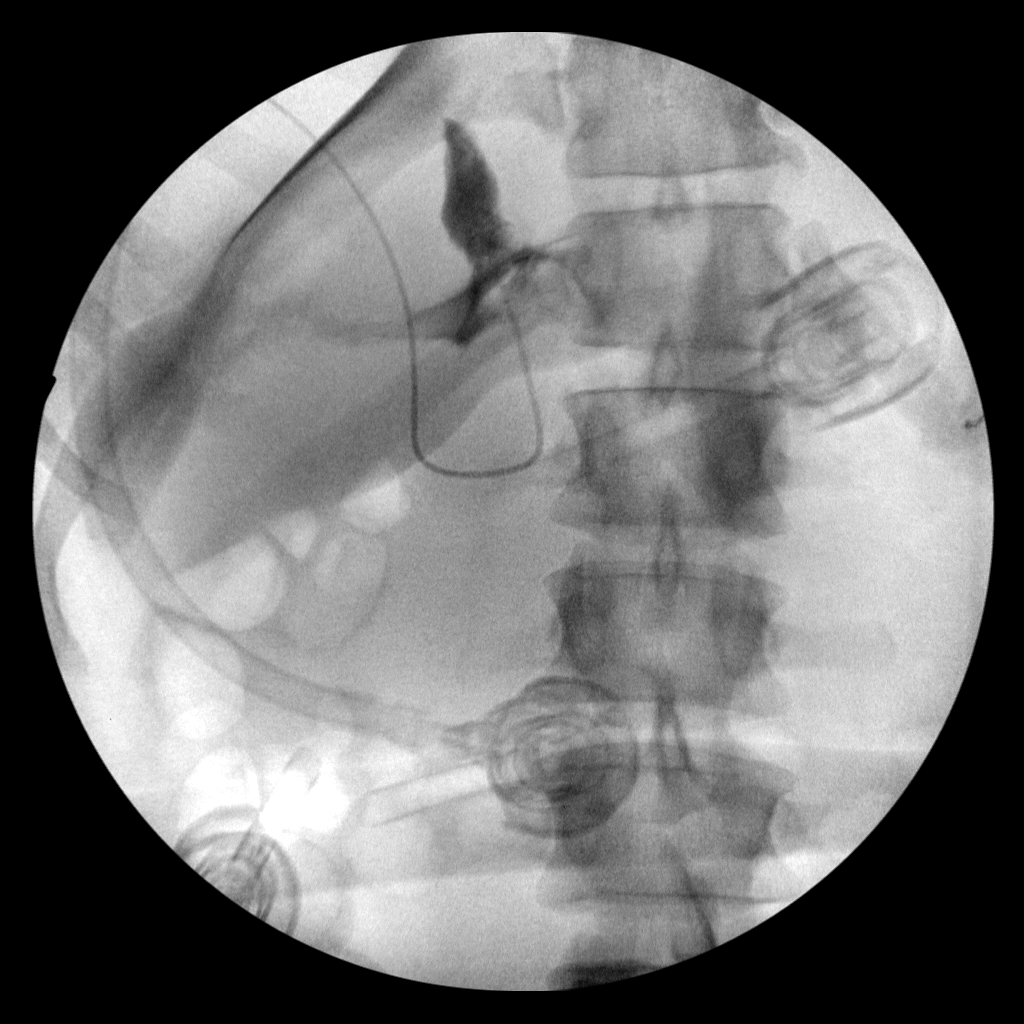

[5 of 5 positions shown; findings below may reference images not displayed]

FINDINGS: Contrast does not fill the biliary system. There is contrast pooling
near the cholecystectomy clips and flowing into the intra-abdominal
cavity.
IMPRESSION: Biliary system was not opacified with contrast. There is contrast
within the abdominal cavity.

## 2021-04-17 ENCOUNTER — Other Ambulatory Visit: Payer: Self-pay

## 2021-04-17 ENCOUNTER — Encounter (HOSPITAL_COMMUNITY): Payer: Self-pay

## 2021-04-17 ENCOUNTER — Ambulatory Visit (HOSPITAL_COMMUNITY)
Admission: EM | Admit: 2021-04-17 | Discharge: 2021-04-17 | Disposition: A | Discharge status: Home / Self Care | Payer: Medicaid Other | Attending: Family Medicine | Admitting: Family Medicine

## 2021-04-17 DIAGNOSIS — M545 Low back pain, unspecified: Secondary | ICD-10-CM | POA: Diagnosis not present

## 2021-04-17 DIAGNOSIS — M25552 Pain in left hip: Secondary | ICD-10-CM | POA: Diagnosis not present

## 2021-04-17 MED ORDER — PREDNISONE 20 MG PO TABS: 20.0000 mg | ORAL_TABLET | Freq: Every day | ORAL | 0 refills | Status: AC

## 2021-04-17 MED ORDER — CYCLOBENZAPRINE HCL 5 MG PO TABS: 5.0000 mg | ORAL_TABLET | Freq: Two times a day (BID) | ORAL | 0 refills | Status: AC | PRN

## 2021-04-17 NOTE — ED Provider Notes (Signed)
[facility]    CSN: [id number] Arrival date & time: 04/17/21  [id number]      History   Chief Complaint Chief Complaint  Patient presents with   Flank Pain    HPI Kristen Valentine is a 31 y.o. female.   Yesterday she started with lower left back pain that she just couldn't move.  She was laying down and when she got up it started hurting.  The pain has continued. Not as bad today.   Pain is constant if she is standing/sitting, but goes away if not putting pressure on it.  Has not happened before.  She is security.  No known injury.  No urinary frequency or pain.  Normal periods;  lmp was this week.  She did take motrin without any help.   Past Medical History:  Diagnosis Date   Chlamydia infection affecting pregnancy 01/07/2016   Pancreatitis 11/11/2017    Patient Active Problem List   Diagnosis Date Noted   Pancreatitis 11/11/2017   Chlamydia infection affecting pregnancy 01/07/2016   Chlamydia infection 01/07/2016   Vaginal delivery 01/05/2016    Past Surgical History:  Procedure Laterality Date   CHOLECYSTECTOMY N/A 11/12/2017   Procedure: LAPAROSCOPIC CHOLECYSTECTOMY WITH INTRAOPERATIVE CHOLANGIOGRAM;  Surgeon: Rolm Bookbinder, MD;  Location: [facility];  Service: General;  Laterality: N/A;    OB History     Gravida  1   Para  1   Term  0   Preterm  0   AB  0   Living  1      SAB  0   IAB  0   Ectopic  0   Multiple  0   Live Births  1            Home Medications    Prior to Admission medications   Medication Sig Start Date End Date Taking? Authorizing Provider  docusate sodium (COLACE) 100 MG capsule Take 1 capsule (100 mg total) by mouth 2 (two) times daily. 11/14/17   Ledell Noss, MD  erythromycin ophthalmic ointment Place a 1/2 inch ribbon of ointment into the lower eyelid 4 times a day for 5 days. 05/06/19   Harris, Abigail, PA-C  fluticasone (FLONASE) 50 MCG/ACT nasal spray Place 1 spray into both nostrils daily. 01/16/19    Sharion Balloon, NP  guaiFENesin (MUCINEX) 600 MG 12 hr tablet Take 2 tablets (1,200 mg total) by mouth 2 (two) times daily as needed. 01/16/19   Sharion Balloon, NP  oxyCODONE (OXY IR/ROXICODONE) 5 MG immediate release tablet Take 1-2 tablets (5-10 mg total) by mouth every 6 (six) hours as needed for severe pain. 11/13/17   Meuth, Blaine Hamper, PA-C    Family History Family History  Problem Relation Age of Onset   Healthy Mother    Healthy Father     Social History Social History   Tobacco Use   Smoking status: Never   Smokeless tobacco: Never  Vaping Use   Vaping Use: Never used  Substance Use Topics   Alcohol use: No   Drug use: No     Allergies   Patient has no known allergies.   Review of Systems Review of Systems  Constitutional: Negative.   HENT: Negative.    Respiratory: Negative.    Cardiovascular: Negative.   Gastrointestinal: Negative.   Genitourinary:  Positive for flank pain. Negative for dysuria, hematuria and urgency.  Musculoskeletal:  Positive for back pain.    Physical Exam Triage Vital Signs ED Triage Vitals [  04/17/21 0853]  Enc Vitals Group     BP 137/82     Pulse Rate 85     Resp 16     Temp 98.5 F (36.9 C)     Temp Source Oral     SpO2 100 %     Weight      Height      Head Circumference      Peak Flow      Pain Score 5     Pain Loc      Pain Edu?      Excl. in [place]?    No data found.  Updated Vital Signs BP 137/82 (BP Location: Left Arm)    Pulse 85    Temp 98.5 F (36.9 C) (Oral)    Resp 16    SpO2 100%   Visual Acuity Right Eye Distance:   Left Eye Distance:   Bilateral Distance:    Right Eye Near:   Left Eye Near:    Bilateral Near:     Physical Exam Constitutional:      Appearance: Normal appearance.  Cardiovascular:     Rate and Rhythm: Normal rate and regular rhythm.  Pulmonary:     Effort: Pulmonary effort is normal.     Breath sounds: Normal breath sounds.  Musculoskeletal:     Lumbar back: Tenderness present.      Left hip: Tenderness present. Decreased range of motion. Decreased strength.     Comments: TTP at the left paraspinal area of the lumbar spine;  TTP at the left lateral hip;  decreased rom due to pain at the hip with flexion.    Neurological:     Mental Status: She is alert.     UC Treatments / Results  Labs (all labs ordered are listed, but only abnormal results are displayed) Labs Reviewed - No data to display  EKG   Radiology No results found.  Procedures Procedures (including critical care time)  Medications Ordered in UC Medications - No data to display  Initial Impression / Assessment and Plan / UC Course  I have reviewed the triage vital signs and the nursing notes.  Pertinent labs & imaging results that were available during my care of the patient were reviewed by me and considered in my medical decision making (see chart for details).    Patient was seen today for left low back/hip pain.  No urinary symptoms.  Exam consistent with musculoskelatal pain.  Mediations sent to pharmacy.  Recommend heat/ice/rest as well.  Advised to follow up if not improving.  Final Clinical Impressions(s) / UC Diagnoses   Final diagnoses:  Acute left-sided low back pain without sciatica  Left hip pain     Discharge Instructions      You were seen today for left low back pain and left hip pain.  I have sent out a muscle relaxer and prednisone to your pharmacy.  The muscle relaxer (cyclobenzaprine) may make you tired so please take when you will be home.  You may also take motirn 600mg  three times/day with food.   I also recommend heat/ice and rest.  Please make an appointment with a primary care doctor for further care if needed, or return to the urgent care.     ED Prescriptions     Medication Sig Dispense Auth. Provider   cyclobenzaprine (FLEXERIL) 5 MG tablet Take 1 tablet (5 mg total) by mouth 2 (two) times daily as needed for up to 5 days for muscle  spasms. 10 tablet  Sheriann Newmann, MD   predniSONE (DELTASONE) 20 MG tablet Take 1 tablet (20 mg total) by mouth daily for 3 days. 3 tablet Rondel Oh, MD      PDMP not reviewed this encounter.   Rondel Oh, MD 04/17/21 0930

## 2021-04-17 NOTE — ED Triage Notes (Signed)
Onset left side/ back pain that started 1-2 days ago. She does not report any injury at this time.

## 2021-04-17 NOTE — Discharge Instructions (Addendum)
You were seen today for left low back pain and left hip pain.  I have sent out a muscle relaxer and prednisone to your pharmacy.  The muscle relaxer (cyclobenzaprine) may make you tired so please take when you will be home.  You may also take motirn 600mg  three times/day with food.   I also recommend heat/ice and rest.  Please make an appointment with a primary care doctor for further care if needed, or return to the urgent care.

## 2022-05-12 ENCOUNTER — Other Ambulatory Visit: Payer: Self-pay

## 2022-05-12 ENCOUNTER — Encounter (HOSPITAL_COMMUNITY): Payer: Self-pay | Admitting: *Deleted

## 2022-05-12 ENCOUNTER — Ambulatory Visit (HOSPITAL_COMMUNITY)
Admission: EM | Admit: 2022-05-12 | Discharge: 2022-05-12 | Disposition: A | Discharge status: Home / Self Care | Payer: Medicaid Other | Attending: Emergency Medicine | Admitting: Emergency Medicine

## 2022-05-12 DIAGNOSIS — R051 Acute cough: Secondary | ICD-10-CM | POA: Diagnosis not present

## 2022-05-12 MED ORDER — PREDNISONE 20 MG PO TABS: 40.0000 mg | ORAL_TABLET | Freq: Every day | ORAL | 0 refills | Status: AC

## 2022-05-12 MED ORDER — BENZONATATE 100 MG PO CAPS: 100.0000 mg | ORAL_CAPSULE | Freq: Three times a day (TID) | ORAL | 0 refills | Status: AC | PRN

## 2022-05-12 NOTE — ED Provider Notes (Signed)
[place]    CSN: [id number] Arrival date & time: 05/12/22  1438     History   Chief Complaint Chief Complaint  Patient presents with   Nasal Congestion   Fever   Cough   Sore Throat    HPI Kristen Valentine is a 32 y.o. female.  Reports cough x 1 week. Sometimes wet/productive No recent fever, had at onset of cough Drinking fluids. Decreased appetite No vomiting Denies shortness of breath. No hx lung problems Has been using mucinex, dayquil/nyquil  Daughter is sick with fever  Past Medical History:  Diagnosis Date   Chlamydia infection affecting pregnancy 01/07/2016   Pancreatitis 11/11/2017    Patient Active Problem List   Diagnosis Date Noted   Pancreatitis 11/11/2017   Chlamydia infection affecting pregnancy 01/07/2016   Chlamydia infection 01/07/2016   Vaginal delivery 01/05/2016    Past Surgical History:  Procedure Laterality Date   CHOLECYSTECTOMY N/A 11/12/2017   Procedure: LAPAROSCOPIC CHOLECYSTECTOMY WITH INTRAOPERATIVE CHOLANGIOGRAM;  Surgeon: Rolm Bookbinder, MD;  Location: [facility];  Service: General;  Laterality: N/A;    OB History     Gravida  1   Para  1   Term  0   Preterm  0   AB  0   Living  1      SAB  0   IAB  0   Ectopic  0   Multiple  0   Live Births  1            Home Medications    Prior to Admission medications   Medication Sig Start Date End Date Taking? Authorizing Provider  benzonatate (TESSALON) 100 MG capsule Take 1 capsule (100 mg total) by mouth 3 (three) times daily as needed. 05/12/22  Yes Christle Nolting, Wells Guiles, PA-C  predniSONE (DELTASONE) 20 MG tablet Take 2 tablets (40 mg total) by mouth daily with breakfast for 5 days. 05/12/22 05/17/22 Yes Tamika Nou, Wells Guiles, PA-C  docusate sodium (COLACE) 100 MG capsule Take 1 capsule (100 mg total) by mouth 2 (two) times daily. 11/14/17   Ledell Noss, MD  fluticasone [facility]) 50 MCG/ACT nasal spray Place 1 spray into both nostrils daily. 01/16/19   Sharion Balloon, NP    Family History Family History  Problem Relation Age of Onset   Healthy Mother    Healthy Father     Social History Social History   Tobacco Use   Smoking status: Never   Smokeless tobacco: Never  Vaping Use   Vaping Use: Never used  Substance Use Topics   Alcohol use: No   Drug use: No     Allergies   Patient has no known allergies.   Review of Systems Review of Systems As per HPI  Physical Exam Triage Vital Signs ED Triage Vitals  Enc Vitals Group     BP 05/12/22 1601 (!) 136/94     Pulse Rate 05/12/22 1601 89     Resp 05/12/22 1601 20     Temp 05/12/22 1601 98.5 F (36.9 C)     Temp src --      SpO2 05/12/22 1601 96 %     Weight --      Height --      Head Circumference --      Peak Flow --      Pain Score 05/12/22 1559 6     Pain Loc --      Pain Edu? --      Excl. in [place]? --  No data found.  Updated Vital Signs BP (!) 136/94   Pulse 89   Temp 98.5 F (36.9 C)   Resp 20   LMP 05/05/2022   SpO2 96%   Breastfeeding No   Physical Exam Vitals and nursing note reviewed.  Constitutional:      General: She is not in acute distress. HENT:     Nose: No congestion or rhinorrhea.     Mouth/Throat:     Mouth: Mucous membranes are moist.     Pharynx: Oropharynx is clear. No posterior oropharyngeal erythema.     Tonsils: No tonsillar exudate.  Eyes:     Conjunctiva/sclera: Conjunctivae normal.  Cardiovascular:     Rate and Rhythm: Normal rate and regular rhythm.     Pulses: Normal pulses.     Heart sounds: Normal heart sounds.  Pulmonary:     Effort: Pulmonary effort is normal. No respiratory distress.     Breath sounds: Normal breath sounds. No wheezing.  Musculoskeletal:     Cervical back: Normal range of motion.  Lymphadenopathy:     Cervical: No cervical adenopathy.  Skin:    General: Skin is warm and dry.  Neurological:     Mental Status: She is alert and oriented to person, place, and time.     UC Treatments /  Results  Labs (all labs ordered are listed, but only abnormal results are displayed) Labs Reviewed - No data to display  EKG   Radiology No results found.  Procedures Procedures (including critical care time)  Medications Ordered in UC Medications - No data to display  Initial Impression / Assessment and Plan / UC Course  I have reviewed the triage vital signs and the nursing notes.  Pertinent labs & imaging results that were available during my care of the patient were reviewed by me and considered in my medical decision making (see chart for details).  Afebrile, well appearing, clear lungs No indication for xray imaging today Discussed viral etiology, symptomatic care, prednisone x 5 days Return precautions discussed. Patient agrees to plan  Final Clinical Impressions(s) / UC Diagnoses   Final diagnoses:  Acute cough     Discharge Instructions      Please continue Mucinex twice daily with lots of fluids. You can add the Tessalon 3 times daily for cough. Take the prednisone as prescribed.  Please monitor symptoms and return if no improvement over the next 5 to 6 days     ED Prescriptions     Medication Sig Dispense Auth. Provider   benzonatate (TESSALON) 100 MG capsule Take 1 capsule (100 mg total) by mouth 3 (three) times daily as needed. 21 capsule Nolie Bignell, PA-C   predniSONE (DELTASONE) 20 MG tablet Take 2 tablets (40 mg total) by mouth daily with breakfast for 5 days. 10 tablet Tarae Wooden, Wells Guiles, PA-C      PDMP not reviewed this encounter.   Cate Oravec, Wells Guiles, [place] 05/12/22 1708

## 2022-05-12 NOTE — Discharge Instructions (Addendum)
Please continue Mucinex twice daily with lots of fluids. You can add the Tessalon 3 times daily for cough. Take the prednisone as prescribed.  Please monitor symptoms and return if no improvement over the next 5 to 6 days

## 2022-05-12 NOTE — ED Triage Notes (Signed)
Pt reports having same Sx's a daughter. P thas a runny nose,cough. Pt has not a fever for past few days.
